# Patient Record
Sex: Male | Born: 1945 | Race: White | Hispanic: No | Marital: Single | Smoking: Former smoker
Health system: Southern US, Community
[De-identification: ages and names within clinical notes are randomized; demographics above are authoritative.]

## PROBLEM LIST (undated history)

## (undated) DIAGNOSIS — I10 Essential (primary) hypertension: Secondary | ICD-10-CM

## (undated) DIAGNOSIS — E7800 Pure hypercholesterolemia, unspecified: Secondary | ICD-10-CM

## (undated) DIAGNOSIS — E876 Hypokalemia: Secondary | ICD-10-CM

## (undated) DIAGNOSIS — K625 Hemorrhage of anus and rectum: Secondary | ICD-10-CM

## (undated) DIAGNOSIS — M109 Gout, unspecified: Secondary | ICD-10-CM

## (undated) DIAGNOSIS — K5790 Diverticulosis of intestine, part unspecified, without perforation or abscess without bleeding: Secondary | ICD-10-CM

## (undated) DIAGNOSIS — K649 Unspecified hemorrhoids: Secondary | ICD-10-CM

## (undated) DIAGNOSIS — I2510 Atherosclerotic heart disease of native coronary artery without angina pectoris: Secondary | ICD-10-CM

## (undated) DIAGNOSIS — K219 Gastro-esophageal reflux disease without esophagitis: Secondary | ICD-10-CM

## (undated) HISTORY — DX: Gastro-esophageal reflux disease without esophagitis: K21.9

## (undated) HISTORY — PX: CORONARY ARTERY BYPASS GRAFT: SHX141

## (undated) HISTORY — PX: OTHER SURGICAL HISTORY: SHX169

## (undated) HISTORY — PX: COLONOSCOPY: SHX174

## (undated) HISTORY — PX: CARDIAC SURGERY: SHX584

---

## 2003-10-04 ENCOUNTER — Emergency Department (HOSPITAL_COMMUNITY): Admission: EM | Admit: 2003-10-04 | Discharge: 2003-10-04 | Payer: Self-pay | Admitting: Interventional Radiology

## 2003-10-08 ENCOUNTER — Ambulatory Visit (HOSPITAL_COMMUNITY): Admission: RE | Admit: 2003-10-08 | Discharge: 2003-10-08 | Payer: Self-pay | Admitting: Orthopedic Surgery

## 2004-03-18 ENCOUNTER — Encounter (HOSPITAL_COMMUNITY): Admission: RE | Admit: 2004-03-18 | Discharge: 2004-04-17 | Payer: Self-pay | Admitting: Pulmonary Disease

## 2004-12-07 ENCOUNTER — Ambulatory Visit (HOSPITAL_COMMUNITY): Admission: RE | Admit: 2004-12-07 | Discharge: 2004-12-07 | Payer: Self-pay | Admitting: Pulmonary Disease

## 2004-12-22 ENCOUNTER — Ambulatory Visit: Payer: Self-pay | Admitting: Cardiology

## 2004-12-22 ENCOUNTER — Encounter: Payer: Self-pay | Admitting: Emergency Medicine

## 2004-12-22 ENCOUNTER — Inpatient Hospital Stay (HOSPITAL_COMMUNITY): Admission: EM | Admit: 2004-12-22 | Discharge: 2004-12-31 | Payer: Self-pay | Admitting: Emergency Medicine

## 2004-12-24 ENCOUNTER — Ambulatory Visit: Payer: Self-pay | Admitting: Internal Medicine

## 2005-01-26 ENCOUNTER — Emergency Department (HOSPITAL_COMMUNITY): Admission: EM | Admit: 2005-01-26 | Discharge: 2005-01-26 | Payer: Self-pay | Admitting: Emergency Medicine

## 2005-04-29 ENCOUNTER — Emergency Department (HOSPITAL_COMMUNITY): Admission: EM | Admit: 2005-04-29 | Discharge: 2005-04-29 | Payer: Self-pay | Admitting: Emergency Medicine

## 2005-07-18 ENCOUNTER — Emergency Department (HOSPITAL_COMMUNITY): Admission: EM | Admit: 2005-07-18 | Discharge: 2005-07-18 | Payer: Self-pay | Admitting: Emergency Medicine

## 2005-07-22 ENCOUNTER — Emergency Department (HOSPITAL_COMMUNITY): Admission: RE | Admit: 2005-07-22 | Discharge: 2005-07-22 | Payer: Self-pay | Admitting: Emergency Medicine

## 2005-07-27 ENCOUNTER — Ambulatory Visit (HOSPITAL_COMMUNITY): Admission: RE | Admit: 2005-07-27 | Discharge: 2005-07-27 | Payer: Self-pay | Admitting: Urology

## 2005-08-06 ENCOUNTER — Emergency Department (HOSPITAL_COMMUNITY): Admission: EM | Admit: 2005-08-06 | Discharge: 2005-08-06 | Payer: Self-pay | Admitting: Emergency Medicine

## 2005-08-15 ENCOUNTER — Emergency Department (HOSPITAL_COMMUNITY): Admission: EM | Admit: 2005-08-15 | Discharge: 2005-08-15 | Payer: Self-pay | Admitting: Emergency Medicine

## 2005-08-23 ENCOUNTER — Emergency Department (HOSPITAL_COMMUNITY): Admission: EM | Admit: 2005-08-23 | Discharge: 2005-08-23 | Payer: Self-pay | Admitting: Emergency Medicine

## 2005-08-27 ENCOUNTER — Emergency Department (HOSPITAL_COMMUNITY): Admission: EM | Admit: 2005-08-27 | Discharge: 2005-08-27 | Payer: Self-pay | Admitting: Emergency Medicine

## 2005-09-18 ENCOUNTER — Emergency Department (HOSPITAL_COMMUNITY): Admission: EM | Admit: 2005-09-18 | Discharge: 2005-09-18 | Payer: Self-pay | Admitting: Emergency Medicine

## 2007-01-05 ENCOUNTER — Emergency Department (HOSPITAL_COMMUNITY): Admission: EM | Admit: 2007-01-05 | Discharge: 2007-01-05 | Payer: Self-pay | Admitting: Emergency Medicine

## 2010-12-28 ENCOUNTER — Emergency Department (HOSPITAL_COMMUNITY)
Admission: EM | Admit: 2010-12-28 | Discharge: 2010-12-28 | Discharge status: Against Medical Advice | Payer: Non-veteran care | Attending: Emergency Medicine | Admitting: Emergency Medicine

## 2010-12-28 ENCOUNTER — Emergency Department (HOSPITAL_COMMUNITY): Payer: Non-veteran care

## 2010-12-28 DIAGNOSIS — J449 Chronic obstructive pulmonary disease, unspecified: Secondary | ICD-10-CM | POA: Insufficient documentation

## 2010-12-28 DIAGNOSIS — R059 Cough, unspecified: Secondary | ICD-10-CM | POA: Insufficient documentation

## 2010-12-28 DIAGNOSIS — J029 Acute pharyngitis, unspecified: Secondary | ICD-10-CM | POA: Insufficient documentation

## 2010-12-28 DIAGNOSIS — J4489 Other specified chronic obstructive pulmonary disease: Secondary | ICD-10-CM | POA: Insufficient documentation

## 2010-12-28 DIAGNOSIS — R05 Cough: Secondary | ICD-10-CM | POA: Insufficient documentation

## 2010-12-28 LAB — RAPID STREP SCREEN (MED CTR MEBANE ONLY): Streptococcus, Group A Screen (Direct): NEGATIVE

## 2012-04-20 ENCOUNTER — Emergency Department (HOSPITAL_COMMUNITY)
Admission: EM | Admit: 2012-04-20 | Discharge: 2012-04-20 | Disposition: A | Discharge status: Home / Self Care | Payer: Non-veteran care | Attending: Emergency Medicine | Admitting: Emergency Medicine

## 2012-04-20 ENCOUNTER — Encounter (HOSPITAL_COMMUNITY): Payer: Self-pay | Admitting: *Deleted

## 2012-04-20 DIAGNOSIS — W268XXA Contact with other sharp object(s), not elsewhere classified, initial encounter: Secondary | ICD-10-CM | POA: Insufficient documentation

## 2012-04-20 DIAGNOSIS — I10 Essential (primary) hypertension: Secondary | ICD-10-CM | POA: Insufficient documentation

## 2012-04-20 DIAGNOSIS — Z951 Presence of aortocoronary bypass graft: Secondary | ICD-10-CM | POA: Insufficient documentation

## 2012-04-20 DIAGNOSIS — S61209A Unspecified open wound of unspecified finger without damage to nail, initial encounter: Secondary | ICD-10-CM | POA: Insufficient documentation

## 2012-04-20 DIAGNOSIS — S61219A Laceration without foreign body of unspecified finger without damage to nail, initial encounter: Secondary | ICD-10-CM

## 2012-04-20 DIAGNOSIS — I2510 Atherosclerotic heart disease of native coronary artery without angina pectoris: Secondary | ICD-10-CM | POA: Insufficient documentation

## 2012-04-20 HISTORY — DX: Essential (primary) hypertension: I10

## 2012-04-20 HISTORY — DX: Atherosclerotic heart disease of native coronary artery without angina pectoris: I25.10
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
## 2012-04-20 NOTE — ED Notes (Signed)
Lac to rt hand when opening a bottle.

## 2012-04-20 NOTE — ED Notes (Signed)
Pt with lac to right index finger, bleeding controlled at this time, believes last tetanus shot in 2008

## 2012-04-21 NOTE — ED Provider Notes (Signed)
Medical screening examination/treatment/procedure(s) were performed by non-physician practitioner and as supervising physician I was immediately available for consultation/collaboration.  Donnetta Hutching, MD 04/21/12 [phone]

## 2013-10-19 ENCOUNTER — Emergency Department (HOSPITAL_COMMUNITY)
Admission: EM | Admit: 2013-10-19 | Discharge: 2013-10-20 | Disposition: A | Discharge status: Home / Self Care | Payer: Non-veteran care | Attending: Emergency Medicine | Admitting: Emergency Medicine

## 2013-10-19 ENCOUNTER — Emergency Department (HOSPITAL_COMMUNITY): Payer: Non-veteran care

## 2013-10-19 ENCOUNTER — Encounter (HOSPITAL_COMMUNITY): Payer: Self-pay | Admitting: Emergency Medicine

## 2013-10-19 DIAGNOSIS — R509 Fever, unspecified: Secondary | ICD-10-CM

## 2013-10-19 DIAGNOSIS — Z951 Presence of aortocoronary bypass graft: Secondary | ICD-10-CM | POA: Insufficient documentation

## 2013-10-19 DIAGNOSIS — J4489 Other specified chronic obstructive pulmonary disease: Secondary | ICD-10-CM | POA: Insufficient documentation

## 2013-10-19 DIAGNOSIS — Z87891 Personal history of nicotine dependence: Secondary | ICD-10-CM | POA: Insufficient documentation

## 2013-10-19 DIAGNOSIS — Z87828 Personal history of other (healed) physical injury and trauma: Secondary | ICD-10-CM | POA: Insufficient documentation

## 2013-10-19 DIAGNOSIS — E7800 Pure hypercholesterolemia, unspecified: Secondary | ICD-10-CM | POA: Insufficient documentation

## 2013-10-19 DIAGNOSIS — R05 Cough: Secondary | ICD-10-CM | POA: Insufficient documentation

## 2013-10-19 DIAGNOSIS — I10 Essential (primary) hypertension: Secondary | ICD-10-CM | POA: Insufficient documentation

## 2013-10-19 DIAGNOSIS — I2510 Atherosclerotic heart disease of native coronary artery without angina pectoris: Secondary | ICD-10-CM | POA: Insufficient documentation

## 2013-10-19 DIAGNOSIS — R059 Cough, unspecified: Secondary | ICD-10-CM | POA: Insufficient documentation

## 2013-10-19 DIAGNOSIS — J449 Chronic obstructive pulmonary disease, unspecified: Secondary | ICD-10-CM | POA: Insufficient documentation

## 2013-10-19 HISTORY — DX: Pure hypercholesterolemia, unspecified: E78.00

## 2013-10-19 HISTORY — DX: Hypokalemia: E87.6

## 2013-10-19 MED ORDER — ACETAMINOPHEN 500 MG PO TABS
1000.0000 mg | ORAL_TABLET | Freq: Once | ORAL | Status: AC
  Administered 2013-10-19: 1000 mg via ORAL
  Filled 2013-10-19: qty 2
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
## 2013-10-19 NOTE — ED Notes (Addendum)
Pt c/o fever and a high heart rate of "101" at home. Pts heart rate here is 92. Pt c/o pain in his feet and hands.

## 2013-10-20 LAB — URINALYSIS, ROUTINE W REFLEX MICROSCOPIC
BILIRUBIN URINE: NEGATIVE
Glucose, UA: NEGATIVE mg/dL
HGB URINE DIPSTICK: NEGATIVE
Ketones, ur: NEGATIVE mg/dL
Leukocytes, UA: NEGATIVE
Nitrite: NEGATIVE
PH: 6 (ref 5.0–8.0)
Protein, ur: NEGATIVE mg/dL
Specific Gravity, Urine: 1.01 (ref 1.005–1.030)
Urobilinogen, UA: 0.2 mg/dL (ref 0.0–1.0)

## 2013-10-20 NOTE — Discharge Instructions (Signed)
Please call your doctor for a followup appointment within 24-48 hours. When you talk to your doctor please let them know that you were seen in the emergency department and have them acquire all of your records so that they can discuss the findings with you and formulate a treatment plan to fully care for your new and ongoing problems. ° °[facility] Doctor List ° ° ° °Edward Hawkins MD. Specialty: Pulmonary Disease Contact information: [address]  °[address]  °[place] [address]  °[phone]  ° °Margaret Simpson, MD. Specialty: Family Medicine Contact information: [address]  °[place] [address]  °[phone]  ° °Scott Luking, MD. Specialty: Family Medicine Contact information: [address]  °[address]  °[place] [address]  °[phone]  ° °Tesfaye Fanta, MD Specialty: Internal Medicine Contact information: [address]  °[place] [address]  °[phone]  ° °Zach Hall, MD. Specialty: Internal Medicine Contact information: [address]  °[place] [address]  °[phone]  ° °Angus Mcinnis, MD. Specialty: Family Medicine Contact information: [address]  °[place] [address]  °[phone]  ° °Stephen Knowlton, MD. Specialty: Family Medicine Contact information: [address]  °[address]  °[place] [address]  °[phone]  ° °Roy Fagan, MD. Specialty: Internal Medicine Contact information: [address]  °[address]  ° [place] [address]  °[phone]  ° ° °

## 2013-12-31 ENCOUNTER — Encounter (HOSPITAL_COMMUNITY): Payer: Self-pay | Admitting: Emergency Medicine

## 2013-12-31 ENCOUNTER — Emergency Department (HOSPITAL_COMMUNITY)
Admission: EM | Admit: 2013-12-31 | Discharge: 2013-12-31 | Disposition: A | Discharge status: Home / Self Care | Payer: Non-veteran care | Attending: Emergency Medicine | Admitting: Emergency Medicine

## 2013-12-31 DIAGNOSIS — Z7982 Long term (current) use of aspirin: Secondary | ICD-10-CM | POA: Insufficient documentation

## 2013-12-31 DIAGNOSIS — L0390 Cellulitis, unspecified: Secondary | ICD-10-CM

## 2013-12-31 DIAGNOSIS — I10 Essential (primary) hypertension: Secondary | ICD-10-CM | POA: Insufficient documentation

## 2013-12-31 DIAGNOSIS — L0291 Cutaneous abscess, unspecified: Secondary | ICD-10-CM

## 2013-12-31 DIAGNOSIS — Z951 Presence of aortocoronary bypass graft: Secondary | ICD-10-CM | POA: Insufficient documentation

## 2013-12-31 DIAGNOSIS — Z87828 Personal history of other (healed) physical injury and trauma: Secondary | ICD-10-CM | POA: Insufficient documentation

## 2013-12-31 DIAGNOSIS — I2510 Atherosclerotic heart disease of native coronary artery without angina pectoris: Secondary | ICD-10-CM | POA: Insufficient documentation

## 2013-12-31 DIAGNOSIS — Z79899 Other long term (current) drug therapy: Secondary | ICD-10-CM | POA: Insufficient documentation

## 2013-12-31 DIAGNOSIS — Z87891 Personal history of nicotine dependence: Secondary | ICD-10-CM | POA: Insufficient documentation

## 2013-12-31 DIAGNOSIS — L02419 Cutaneous abscess of limb, unspecified: Secondary | ICD-10-CM | POA: Insufficient documentation

## 2013-12-31 DIAGNOSIS — E785 Hyperlipidemia, unspecified: Secondary | ICD-10-CM | POA: Insufficient documentation

## 2013-12-31 DIAGNOSIS — L03119 Cellulitis of unspecified part of limb: Principal | ICD-10-CM

## 2013-12-31 MED ORDER — SULFAMETHOXAZOLE-TRIMETHOPRIM 800-160 MG PO TABS: 1.0000 | ORAL_TABLET | Freq: Two times a day (BID) | ORAL | Status: DC
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
## 2013-12-31 NOTE — Discharge Instructions (Signed)
Abscess Care After An abscess (also called a boil or furuncle) is an infected area that contains a collection of pus. Signs and symptoms of an abscess include pain, tenderness, redness, or hardness, or you may feel a moveable soft area under your skin. An abscess can occur anywhere in the body. The infection may spread to surrounding tissues causing cellulitis. The boil may be painful for 5 to 7 days. Most people with a boil do not have high fevers. Your abscess, if seen early, may not have localized, and may not have been lanced. If not, another appointment may be required for this if it does not get better on its own or with medications. HOME CARE INSTRUCTIONS   Only take over-the-counter or prescription medicines for pain, discomfort, or fever as directed by your caregiver.  When you bathe, soak and then remove gauze or iodoform packs at least daily or as directed by your caregiver. You may then wash the wound gently with mild soapy water. Repack with gauze or do as your caregiver directs. SEEK IMMEDIATE MEDICAL CARE IF:   You develop increased pain, swelling, redness, drainage, or bleeding in the wound site.  You develop signs of generalized infection including muscle aches, chills, fever, or a general ill feeling.  An oral temperature above 102 F (38.9 C) develops, not controlled by medication. See your caregiver for a recheck if you develop any of the symptoms described above. If medications (antibiotics) were prescribed, take them as directed. Document Released: 03/17/2005 Document Revised: 11/21/2011 Document Reviewed: 11/12/2007 [facility] Patient Information 2014 [place].

## 2013-12-31 NOTE — ED Notes (Signed)
Pt with abscess to left upper inner thigh for 3 weeks and states started draining yesterday, hx of same, fever on Sat. Night denies fever since

## 2013-12-31 NOTE — ED Notes (Signed)
Abscess to lt upper medial thigh, has opened up and drained on its own.  Some redness around it

## 2014-01-01 NOTE — ED Provider Notes (Signed)
Medical screening examination/treatment/procedure(s) were performed by non-physician practitioner and as supervising physician I was immediately available for consultation/collaboration.   EKG Interpretation None        Alfonzo Feller, DO 01/01/14 [phone]

## 2014-02-26 ENCOUNTER — Emergency Department (HOSPITAL_COMMUNITY)
Admission: EM | Admit: 2014-02-26 | Discharge: 2014-02-26 | Disposition: A | Discharge status: Home / Self Care | Payer: Non-veteran care | Attending: Emergency Medicine | Admitting: Emergency Medicine

## 2014-02-26 ENCOUNTER — Encounter (HOSPITAL_COMMUNITY): Payer: Self-pay | Admitting: Emergency Medicine

## 2014-02-26 DIAGNOSIS — I10 Essential (primary) hypertension: Secondary | ICD-10-CM | POA: Insufficient documentation

## 2014-02-26 DIAGNOSIS — Z7982 Long term (current) use of aspirin: Secondary | ICD-10-CM | POA: Insufficient documentation

## 2014-02-26 DIAGNOSIS — Z87891 Personal history of nicotine dependence: Secondary | ICD-10-CM | POA: Insufficient documentation

## 2014-02-26 DIAGNOSIS — I2510 Atherosclerotic heart disease of native coronary artery without angina pectoris: Secondary | ICD-10-CM | POA: Insufficient documentation

## 2014-02-26 DIAGNOSIS — Z79899 Other long term (current) drug therapy: Secondary | ICD-10-CM | POA: Insufficient documentation

## 2014-02-26 DIAGNOSIS — Z951 Presence of aortocoronary bypass graft: Secondary | ICD-10-CM | POA: Insufficient documentation

## 2014-02-26 DIAGNOSIS — L0390 Cellulitis, unspecified: Secondary | ICD-10-CM

## 2014-02-26 DIAGNOSIS — L02419 Cutaneous abscess of limb, unspecified: Secondary | ICD-10-CM | POA: Insufficient documentation

## 2014-02-26 DIAGNOSIS — E7800 Pure hypercholesterolemia, unspecified: Secondary | ICD-10-CM | POA: Insufficient documentation

## 2014-02-26 DIAGNOSIS — Z792 Long term (current) use of antibiotics: Secondary | ICD-10-CM | POA: Insufficient documentation

## 2014-02-26 DIAGNOSIS — L03119 Cellulitis of unspecified part of limb: Principal | ICD-10-CM

## 2014-02-26 MED ORDER — OXYCODONE-ACETAMINOPHEN 5-325 MG PO TABS
2.0000 | ORAL_TABLET | Freq: Once | ORAL | Status: AC
  Administered 2014-02-26: 2 via ORAL
  Filled 2014-02-26: qty 2

## 2014-02-26 MED ORDER — IBUPROFEN 400 MG PO TABS: 400.0000 mg | ORAL_TABLET | Freq: Four times a day (QID) | ORAL | Status: DC | PRN

## 2014-02-26 MED ORDER — DOXYCYCLINE HYCLATE 100 MG PO CAPS: 100.0000 mg | ORAL_CAPSULE | Freq: Two times a day (BID) | ORAL | Status: DC

## 2014-02-26 NOTE — ED Notes (Signed)
Abscess to left leg x 3 days.

## 2014-02-26 NOTE — ED Notes (Signed)
Patient with no complaints at this time. Respirations even and unlabored. Skin warm/dry. Discharge instructions reviewed with patient at this time. Patient given opportunity to voice concerns/ask questions. Patient discharged at this time and left Emergency Department with steady gait.   
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
## 2014-02-26 NOTE — Discharge Instructions (Signed)
Cellulitis Cellulitis is an infection of the skin and the tissue beneath it. The infected area is usually red and tender. Cellulitis occurs most often in the arms and lower legs.  CAUSES  Cellulitis is caused by bacteria that enter the skin through cracks or cuts in the skin. The most common types of bacteria that cause cellulitis are Staphylococcus and Streptococcus. SYMPTOMS   Redness and warmth.  Swelling.  Tenderness or pain.  Fever. DIAGNOSIS  Your caregiver can usually determine what is wrong based on a physical exam. Blood tests may also be done. TREATMENT  Treatment usually involves taking an antibiotic medicine. HOME CARE INSTRUCTIONS   Take your antibiotics as directed. Finish them even if you start to feel better.  Keep the infected arm or leg elevated to reduce swelling.  Apply a warm cloth to the affected area up to 4 times per day to relieve pain.  Only take over-the-counter or prescription medicines for pain, discomfort, or fever as directed by your caregiver.  Keep all follow-up appointments as directed by your caregiver. SEEK MEDICAL CARE IF:   You notice red streaks coming from the infected area.  Your red area gets larger or turns dark in color.  Your bone or joint underneath the infected area becomes painful after the skin has healed.  Your infection returns in the same area or another area.  You notice a swollen bump in the infected area.  You develop new symptoms. SEEK IMMEDIATE MEDICAL CARE IF:   You have a fever.  You feel very sleepy.  You develop vomiting or diarrhea.  You have a general ill feeling (malaise) with muscle aches and pains. MAKE SURE YOU:   Understand these instructions.  Will watch your condition.  Will get help right away if you are not doing well or get worse. Document Released: 06/08/2005 Document Revised: 02/28/2012 Document Reviewed: 11/14/2011 [facility] Patient Information 2015 [place], [place]. This information is  not intended to replace advice given to you by your health care provider. Make sure you discuss any questions you have with your health care provider.  Cellulitis Cellulitis is an infection of the skin and the tissue beneath it. The infected area is usually red and tender. Cellulitis occurs most often in the arms and lower legs.  CAUSES  Cellulitis is caused by bacteria that enter the skin through cracks or cuts in the skin. The most common types of bacteria that cause cellulitis are Staphylococcus and Streptococcus. SYMPTOMS   Redness and warmth.  Swelling.  Tenderness or pain.  Fever. DIAGNOSIS  Your caregiver can usually determine what is wrong based on a physical exam. Blood tests may also be done. TREATMENT  Treatment usually involves taking an antibiotic medicine. HOME CARE INSTRUCTIONS   Take your antibiotics as directed. Finish them even if you start to feel better.  Keep the infected arm or leg elevated to reduce swelling.  Apply a warm cloth to the affected area up to 4 times per day to relieve pain.  Only take over-the-counter or prescription medicines for pain, discomfort, or fever as directed by your caregiver.  Keep all follow-up appointments as directed by your caregiver. SEEK MEDICAL CARE IF:   You notice red streaks coming from the infected area.  Your red area gets larger or turns dark in color.  Your bone or joint underneath the infected area becomes painful after the skin has healed.  Your infection returns in the same area or another area.  You notice a swollen bump  in the infected area.  You develop new symptoms. SEEK IMMEDIATE MEDICAL CARE IF:   You have a fever.  You feel very sleepy.  You develop vomiting or diarrhea.  You have a general ill feeling (malaise) with muscle aches and pains. MAKE SURE YOU:   Understand these instructions.  Will watch your condition.  Will get help right away if you are not doing well or get  worse. Document Released: 06/08/2005 Document Revised: 02/28/2012 Document Reviewed: 11/14/2011 [facility] Patient Information 2015 [place], [place]. This information is not intended to replace advice given to you by your health care provider. Make sure you discuss any questions you have with your health care provider.

## 2014-07-17 ENCOUNTER — Encounter: Payer: Self-pay | Admitting: Gastroenterology

## 2014-08-21 ENCOUNTER — Encounter: Payer: Self-pay | Admitting: Gastroenterology

## 2014-08-21 ENCOUNTER — Ambulatory Visit (INDEPENDENT_AMBULATORY_CARE_PROVIDER_SITE_OTHER): Payer: Non-veteran care | Admitting: Gastroenterology

## 2014-08-21 VITALS — BP 120/67 | HR 75 | Temp 97.7°F | Ht 67.0 in | Wt 198.8 lb

## 2014-08-21 DIAGNOSIS — Z86010 Personal history of colonic polyps: Secondary | ICD-10-CM

## 2014-08-21 DIAGNOSIS — K219 Gastro-esophageal reflux disease without esophagitis: Secondary | ICD-10-CM | POA: Insufficient documentation
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
## 2014-08-21 NOTE — Patient Instructions (Addendum)
Start taking Dexilant once each morning; I have provided samples. Call us if this works well for you, and we can send to the pharmacy. Please review the reflux diet.   I will get the reports from the colonoscopy to see when we need to pursue another one.   In the meantime, start taking Benefiber or Metamucil daily as a fiber supplement.   Gastroesophageal Reflux Disease, Adult Gastroesophageal reflux disease (GERD) happens when acid from your stomach flows up into the esophagus. When acid comes in contact with the esophagus, the acid causes soreness (inflammation) in the esophagus. Over time, GERD may create small holes (ulcers) in the lining of the esophagus. CAUSES   Increased body weight. This puts pressure on the stomach, making acid rise from the stomach into the esophagus.  Smoking. This increases acid production in the stomach.  Drinking alcohol. This causes decreased pressure in the lower esophageal sphincter (valve or ring of muscle between the esophagus and stomach), allowing acid from the stomach into the esophagus.  Late evening meals and a full stomach. This increases pressure and acid production in the stomach.  A malformed lower esophageal sphincter. Sometimes, no cause is found. SYMPTOMS   Burning pain in the lower part of the mid-chest behind the breastbone and in the mid-stomach area. This may occur twice a week or more often.  Trouble swallowing.  Sore throat.  Dry cough.  Asthma-like symptoms including chest tightness, shortness of breath, or wheezing. DIAGNOSIS  Your caregiver may be able to diagnose GERD based on your symptoms. In some cases, X-rays and other tests may be done to check for complications or to check the condition of your stomach and esophagus. TREATMENT  Your caregiver may recommend over-the-counter or prescription medicines to help decrease acid production. Ask your caregiver before starting or adding any new medicines.  HOME CARE INSTRUCTIONS    Change the factors that you can control. Ask your caregiver for guidance concerning weight loss, quitting smoking, and alcohol consumption.  Avoid foods and drinks that make your symptoms worse, such as:  Caffeine or alcoholic drinks.  Chocolate.  Peppermint or mint flavorings.  Garlic and onions.  Spicy foods.  Citrus fruits, such as oranges, lemons, or limes.  Tomato-based foods such as sauce, chili, salsa, and pizza.  Fried and fatty foods.  Avoid lying down for the 3 hours prior to your bedtime or prior to taking a nap.  Eat small, frequent meals instead of large meals.  Wear loose-fitting clothing. Do not wear anything tight around your waist that causes pressure on your stomach.  Raise the head of your bed 6 to 8 inches with wood blocks to help you sleep. Extra pillows will not help.  Only take over-the-counter or prescription medicines for pain, discomfort, or fever as directed by your caregiver.  Do not take aspirin, ibuprofen, or other nonsteroidal anti-inflammatory drugs (NSAIDs). SEEK IMMEDIATE MEDICAL CARE IF:   You have pain in your arms, neck, jaw, teeth, or back.  Your pain increases or changes in intensity or duration.  You develop nausea, vomiting, or sweating (diaphoresis).  You develop shortness of breath, or you faint.  Your vomit is green, yellow, black, or looks like coffee grounds or blood.  Your stool is red, bloody, or black. These symptoms could be signs of other problems, such as heart disease, gastric bleeding, or esophageal bleeding. MAKE SURE YOU:   Understand these instructions.  Will watch your condition.  Will get help right away if you are  not doing well or get worse. Document Released: 06/08/2005 Document Revised: 11/21/2011 Document Reviewed: 03/18/2011 [facility] Patient Information 2015 [place], [place]. This information is not intended to replace advice given to you by your health care provider. Make sure you discuss any  questions you have with your health care provider.

## 2014-08-24 NOTE — Assessment & Plan Note (Addendum)
Symptomatic despite Protonix BID. Question dietary and behavior playing a role. GERD diet provided. Trial of Dexilant, samples provided. May ultimately need EGD if no improvement with diet changes. Would need Propofol due to history of failed sedation.

## 2014-08-24 NOTE — Assessment & Plan Note (Signed)
68 year old male with reported adenomatous polyps on recent colonoscopy in Aug 2015. Need to request operative notes. Low-volume hematochezia intermittently likely benign. Patient requesting updated colonoscopy. Discussed needing operative notes prior to any decision made. Could benefit from [place] banding but need to review notes first. In interim, add supplemental fiber daily.

## 2014-08-25 NOTE — Progress Notes (Signed)
cc'ed to [facility]

## 2014-08-29 ENCOUNTER — Other Ambulatory Visit: Payer: Self-pay

## 2014-09-01 MED ORDER — DEXLANSOPRAZOLE 60 MG PO CPDR: 60.0000 mg | DELAYED_RELEASE_CAPSULE | Freq: Every day | ORAL | Status: DC

## 2014-11-04 ENCOUNTER — Emergency Department (HOSPITAL_COMMUNITY)
Admission: EM | Admit: 2014-11-04 | Discharge: 2014-11-04 | Disposition: A | Discharge status: Home / Self Care | Payer: Non-veteran care | Attending: Emergency Medicine | Admitting: Emergency Medicine

## 2014-11-04 ENCOUNTER — Emergency Department (HOSPITAL_COMMUNITY): Payer: Non-veteran care

## 2014-11-04 ENCOUNTER — Encounter (HOSPITAL_COMMUNITY): Payer: Self-pay | Admitting: *Deleted

## 2014-11-04 DIAGNOSIS — K219 Gastro-esophageal reflux disease without esophagitis: Secondary | ICD-10-CM | POA: Diagnosis not present

## 2014-11-04 DIAGNOSIS — Z79899 Other long term (current) drug therapy: Secondary | ICD-10-CM | POA: Diagnosis not present

## 2014-11-04 DIAGNOSIS — Y998 Other external cause status: Secondary | ICD-10-CM | POA: Diagnosis not present

## 2014-11-04 DIAGNOSIS — R52 Pain, unspecified: Secondary | ICD-10-CM

## 2014-11-04 DIAGNOSIS — Y9289 Other specified places as the place of occurrence of the external cause: Secondary | ICD-10-CM | POA: Insufficient documentation

## 2014-11-04 DIAGNOSIS — M1990 Unspecified osteoarthritis, unspecified site: Secondary | ICD-10-CM | POA: Diagnosis not present

## 2014-11-04 DIAGNOSIS — I252 Old myocardial infarction: Secondary | ICD-10-CM | POA: Diagnosis not present

## 2014-11-04 DIAGNOSIS — R079 Chest pain, unspecified: Secondary | ICD-10-CM | POA: Diagnosis not present

## 2014-11-04 DIAGNOSIS — X58XXXA Exposure to other specified factors, initial encounter: Secondary | ICD-10-CM | POA: Insufficient documentation

## 2014-11-04 DIAGNOSIS — Y9389 Activity, other specified: Secondary | ICD-10-CM | POA: Insufficient documentation

## 2014-11-04 DIAGNOSIS — I10 Essential (primary) hypertension: Secondary | ICD-10-CM | POA: Insufficient documentation

## 2014-11-04 DIAGNOSIS — Z7982 Long term (current) use of aspirin: Secondary | ICD-10-CM | POA: Diagnosis not present

## 2014-11-04 DIAGNOSIS — M19071 Primary osteoarthritis, right ankle and foot: Secondary | ICD-10-CM

## 2014-11-04 DIAGNOSIS — S93401A Sprain of unspecified ligament of right ankle, initial encounter: Secondary | ICD-10-CM

## 2014-11-04 DIAGNOSIS — E785 Hyperlipidemia, unspecified: Secondary | ICD-10-CM | POA: Insufficient documentation

## 2014-11-04 DIAGNOSIS — S99911A Unspecified injury of right ankle, initial encounter: Secondary | ICD-10-CM | POA: Diagnosis present

## 2014-11-04 MED ORDER — ACETAMINOPHEN 500 MG PO TABS
1000.0000 mg | ORAL_TABLET | Freq: Once | ORAL | Status: AC
  Administered 2014-11-04: 1000 mg via ORAL
  Filled 2014-11-04: qty 2

## 2014-11-04 NOTE — Discharge Instructions (Signed)
Please use the ankle splint for the next 5 to 7 days. Please return if not improving. Ankle Sprain An ankle sprain is an injury to the strong, fibrous tissues (ligaments) that hold the bones of your ankle joint together.  CAUSES An ankle sprain is usually caused by a fall or by twisting your ankle. Ankle sprains most commonly occur when you step on the outer edge of your foot, and your ankle turns inward. People who participate in sports are more prone to these types of injuries.  SYMPTOMS   Pain in your ankle. The pain may be present at rest or only when you are trying to stand or walk.  Swelling.  Bruising. Bruising may develop immediately or within 1 to 2 days after your injury.  Difficulty standing or walking, particularly when turning corners or changing directions. DIAGNOSIS  Your caregiver will ask you details about your injury and perform a physical exam of your ankle to determine if you have an ankle sprain. During the physical exam, your caregiver will press on and apply pressure to specific areas of your foot and ankle. Your caregiver will try to move your ankle in certain ways. An X-ray exam may be done to be sure a bone was not broken or a ligament did not separate from one of the bones in your ankle (avulsion fracture).  TREATMENT  Certain types of braces can help stabilize your ankle. Your caregiver can make a recommendation for this. Your caregiver may recommend the use of medicine for pain. If your sprain is severe, your caregiver may refer you to a surgeon who helps to restore function to parts of your skeletal system (orthopedist) or a physical therapist. [place] ice to your injury for 1-2 days or as directed by your caregiver. Applying ice helps to reduce inflammation and pain.  Put ice in a plastic bag.  Place a towel between your skin and the bag.  Leave the ice on for 15-20 minutes at a time, every 2 hours while you are awake.  Only take  over-the-counter or prescription medicines for pain, discomfort, or fever as directed by your caregiver.  Elevate your injured ankle above the level of your heart as much as possible for 2-3 days.  If your caregiver recommends crutches, use them as instructed. Gradually put weight on the affected ankle. Continue to use crutches or a cane until you can walk without feeling pain in your ankle.  If you have a plaster splint, wear the splint as directed by your caregiver. Do not rest it on anything harder than a pillow for the first 24 hours. Do not put weight on it. Do not get it wet. You may take it off to take a shower or bath.  You may have been given an elastic bandage to wear around your ankle to provide support. If the elastic bandage is too tight (you have numbness or tingling in your foot or your foot becomes cold and blue), adjust the bandage to make it comfortable.  If you have an air splint, you may blow more air into it or let air out to make it more comfortable. You may take your splint off at night and before taking a shower or bath. Wiggle your toes in the splint several times per day to decrease swelling. SEEK MEDICAL CARE IF:   You have rapidly increasing bruising or swelling.  Your toes feel extremely cold or you lose feeling in your foot.  Your pain  is not relieved with medicine. SEEK IMMEDIATE MEDICAL CARE IF:  Your toes are numb or blue.  You have severe pain that is increasing. MAKE SURE YOU:   Understand these instructions.  Will watch your condition.  Will get help right away if you are not doing well or get worse. Document Released: 08/29/2005 Document Revised: 05/23/2012 Document Reviewed: 09/10/2011 [facility] Patient Information 2015 [place], [place]. This information is not intended to replace advice given to you by your health care provider. Make sure you discuss any questions you have with your health care provider.  Arthritis, Nonspecific Arthritis is  inflammation of a joint. This usually means pain, redness, warmth or swelling are present. One or more joints may be involved. There are a number of types of arthritis. Your caregiver may not be able to tell what type of arthritis you have right away. CAUSES  The most common cause of arthritis is the wear and tear on the joint (osteoarthritis). This causes damage to the cartilage, which can break down over time. The knees, hips, back and neck are most often affected by this type of arthritis. Other types of arthritis and common causes of joint pain include:  Sprains and other injuries near the joint. Sometimes minor sprains and injuries cause pain and swelling that develop hours later.  Rheumatoid arthritis. This affects hands, feet and knees. It usually affects both sides of your body at the same time. It is often associated with chronic ailments, fever, weight loss and general weakness.  Crystal arthritis. Gout and pseudo gout can cause occasional acute severe pain, redness and swelling in the foot, ankle, or knee.  Infectious arthritis. Bacteria can get into a joint through a break in overlying skin. This can cause infection of the joint. Bacteria and viruses can also spread through the blood and affect your joints.  Drug, infectious and allergy reactions. Sometimes joints can become mildly painful and slightly swollen with these types of illnesses. SYMPTOMS   Pain is the main symptom.  Your joint or joints can also be red, swollen and warm or hot to the touch.  You may have a fever with certain types of arthritis, or even feel overall ill.  The joint with arthritis will hurt with movement. Stiffness is present with some types of arthritis. DIAGNOSIS  Your caregiver will suspect arthritis based on your description of your symptoms and on your exam. Testing may be needed to find the type of arthritis:  Blood and sometimes urine tests.  X-ray tests and sometimes CT or MRI  scans.  Removal of fluid from the joint (arthrocentesis) is done to check for bacteria, crystals or other causes. Your caregiver (or a specialist) will numb the area over the joint with a local anesthetic, and use a needle to remove joint fluid for examination. This procedure is only minimally uncomfortable.  Even with these tests, your caregiver may not be able to tell what kind of arthritis you have. Consultation with a specialist (rheumatologist) may be helpful. TREATMENT  Your caregiver will discuss with you treatment specific to your type of arthritis. If the specific type cannot be determined, then the following general recommendations may apply. Treatment of severe joint pain includes:  Rest.  Elevation.  Anti-inflammatory medication (for example, ibuprofen) may be prescribed. Avoiding activities that cause increased pain.  Only take over-the-counter or prescription medicines for pain and discomfort as recommended by your caregiver.  Cold packs over an inflamed joint may be used for 10 to 15 minutes every  hour. Hot packs sometimes feel better, but do not use overnight. Do not use hot packs if you are diabetic without your caregiver's permission.  A cortisone shot into arthritic joints may help reduce pain and swelling.  Any acute arthritis that gets worse over the next 1 to 2 days needs to be looked at to be sure there is no joint infection. Long-term arthritis treatment involves modifying activities and lifestyle to reduce joint stress jarring. This can include weight loss. Also, exercise is needed to nourish the joint cartilage and remove waste. This helps keep the muscles around the joint strong. HOME CARE INSTRUCTIONS   Do not take aspirin to relieve pain if gout is suspected. This elevates uric acid levels.  Only take over-the-counter or prescription medicines for pain, discomfort or fever as directed by your caregiver.  Rest the joint as much as possible.  If your joint is  swollen, keep it elevated.  Use crutches if the painful joint is in your leg.  Drinking plenty of fluids may help for certain types of arthritis.  Follow your caregiver's dietary instructions.  Try low-impact exercise such as:  Swimming.  Water aerobics.  Biking.  Walking.  Morning stiffness is often relieved by a warm shower.  Put your joints through regular range-of-motion. SEEK MEDICAL CARE IF:   You do not feel better in 24 hours or are getting worse.  You have side effects to medications, or are not getting better with treatment. SEEK IMMEDIATE MEDICAL CARE IF:   You have a fever.  You develop severe joint pain, swelling or redness.  Many joints are involved and become painful and swollen.  There is severe back pain and/or leg weakness.  You have loss of bowel or bladder control. Document Released: 10/06/2004 Document Revised: 11/21/2011 Document Reviewed: 10/22/2008 [facility] Patient Information 2015 [place], [place]. This information is not intended to replace advice given to you by your health care provider. Make sure you discuss any questions you have with your health care provider.
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
## 2014-11-04 NOTE — ED Notes (Addendum)
Pain rt ankle 6 days, pt says he has injured in past , but no recent new injury known  Points to lat malleolus as site of pain  Walking with limp

## 2015-03-24 ENCOUNTER — Emergency Department (HOSPITAL_COMMUNITY)
Admission: EM | Admit: 2015-03-24 | Discharge: 2015-03-24 | Disposition: A | Discharge status: Home / Self Care | Payer: Non-veteran care | Attending: Emergency Medicine | Admitting: Emergency Medicine

## 2015-03-24 ENCOUNTER — Encounter (HOSPITAL_COMMUNITY): Payer: Self-pay | Admitting: Emergency Medicine

## 2015-03-24 ENCOUNTER — Emergency Department (HOSPITAL_COMMUNITY): Payer: Non-veteran care

## 2015-03-24 DIAGNOSIS — I10 Essential (primary) hypertension: Secondary | ICD-10-CM | POA: Diagnosis not present

## 2015-03-24 DIAGNOSIS — Z7982 Long term (current) use of aspirin: Secondary | ICD-10-CM | POA: Insufficient documentation

## 2015-03-24 DIAGNOSIS — I2510 Atherosclerotic heart disease of native coronary artery without angina pectoris: Secondary | ICD-10-CM | POA: Insufficient documentation

## 2015-03-24 DIAGNOSIS — K219 Gastro-esophageal reflux disease without esophagitis: Secondary | ICD-10-CM | POA: Diagnosis not present

## 2015-03-24 DIAGNOSIS — M79672 Pain in left foot: Secondary | ICD-10-CM | POA: Diagnosis present

## 2015-03-24 DIAGNOSIS — M109 Gout, unspecified: Secondary | ICD-10-CM

## 2015-03-24 DIAGNOSIS — E780 Pure hypercholesterolemia: Secondary | ICD-10-CM | POA: Diagnosis not present

## 2015-03-24 DIAGNOSIS — M10072 Idiopathic gout, left ankle and foot: Secondary | ICD-10-CM | POA: Diagnosis not present

## 2015-03-24 DIAGNOSIS — Z9861 Coronary angioplasty status: Secondary | ICD-10-CM | POA: Insufficient documentation

## 2015-03-24 DIAGNOSIS — Z87891 Personal history of nicotine dependence: Secondary | ICD-10-CM | POA: Diagnosis not present

## 2015-03-24 DIAGNOSIS — Z79899 Other long term (current) drug therapy: Secondary | ICD-10-CM | POA: Diagnosis not present

## 2015-03-24 DIAGNOSIS — E876 Hypokalemia: Secondary | ICD-10-CM | POA: Insufficient documentation

## 2015-03-24 LAB — CBC WITH DIFFERENTIAL/PLATELET
Basophils Absolute: 0 10*3/uL (ref 0.0–0.1)
Basophils Relative: 0 % (ref 0–1)
EOS ABS: 0.3 10*3/uL (ref 0.0–0.7)
EOS PCT: 3 % (ref 0–5)
HEMATOCRIT: 41.7 % (ref 39.0–52.0)
Hemoglobin: 14.3 g/dL (ref 13.0–17.0)
LYMPHS ABS: 1.1 10*3/uL (ref 0.7–4.0)
Lymphocytes Relative: 11 % — ABNORMAL LOW (ref 12–46)
MCH: 31 pg (ref 26.0–34.0)
MCHC: 34.3 g/dL (ref 30.0–36.0)
MCV: 90.5 fL (ref 78.0–100.0)
MONOS PCT: 10 % (ref 3–12)
Monocytes Absolute: 1 10*3/uL (ref 0.1–1.0)
Neutro Abs: 7.3 10*3/uL (ref 1.7–7.7)
Neutrophils Relative %: 76 % (ref 43–77)
Platelets: 153 10*3/uL (ref 150–400)
RBC: 4.61 MIL/uL (ref 4.22–5.81)
RDW: 13.4 % (ref 11.5–15.5)
WBC: 9.7 10*3/uL (ref 4.0–10.5)

## 2015-03-24 LAB — CBG MONITORING, ED: Glucose-Capillary: 74 mg/dL (ref 65–99)

## 2015-03-24 LAB — URIC ACID: URIC ACID, SERUM: 11.2 mg/dL — AB (ref 4.4–7.6)

## 2015-03-24 MED ORDER — PREDNISONE 10 MG PO TABS: ORAL_TABLET | ORAL | Status: DC

## 2015-03-24 MED ORDER — OXYCODONE-ACETAMINOPHEN 5-325 MG PO TABS
1.0000 | ORAL_TABLET | Freq: Once | ORAL | Status: AC
  Administered 2015-03-24: 1 via ORAL
  Filled 2015-03-24: qty 1

## 2015-03-24 MED ORDER — PREDNISONE 50 MG PO TABS
60.0000 mg | ORAL_TABLET | Freq: Once | ORAL | Status: AC
  Administered 2015-03-24: 60 mg via ORAL
  Filled 2015-03-24 (×2): qty 1

## 2015-03-24 MED ORDER — HYDROCODONE-ACETAMINOPHEN 5-325 MG PO TABS: ORAL_TABLET | ORAL | Status: DC

## 2015-03-24 NOTE — ED Notes (Signed)
Pain to left foot for last 2-3 weeks and pain is increasing.  Rates pain 9/10.  Did not take any pain medication today.

## 2015-03-24 NOTE — Discharge Instructions (Signed)
Gout Gout is when your joints become red, sore, and swell (inflamed). This is caused by the buildup of uric acid crystals in the joints. Uric acid is a chemical that is normally in the blood. If the level of uric acid gets too high in the blood, these crystals form in your joints and tissues. Over time, these crystals can form into masses near the joints and tissues. These masses can destroy bone and cause the bone to look misshapen (deformed). HOME CARE   Do not take aspirin for pain.  Only take medicine as told by your doctor.  Rest the joint as much as you can. When in bed, keep sheets and blankets off painful areas.  Keep the sore joints raised (elevated).  Put warm or cold packs on painful joints. Use of warm or cold packs depends on which works best for you.  Use crutches if the painful joint is in your leg.  Drink enough fluids to keep your pee (urine) clear or pale yellow. Limit alcohol, sugary drinks, and drinks with fructose in them.  Follow your diet instructions. Pay careful attention to how much protein you eat. Include fruits, vegetables, whole grains, and fat-free or low-fat milk products in your daily diet. Talk to your doctor or dietitian about the use of coffee, vitamin C, and cherries. These may help lower uric acid levels.  Keep a healthy body weight. GET HELP RIGHT AWAY IF:   You have watery poop (diarrhea), throw up (vomit), or have any side effects from medicines.  You do not feel better in 24 hours, or you are getting worse.  Your joint becomes suddenly more tender, and you have chills or a fever. MAKE SURE YOU:   Understand these instructions.  Will watch your condition.  Will get help right away if you are not doing well or get worse. Document Released: 06/07/2008 Document Revised: 01/13/2014 Document Reviewed: 04/11/2012 [facility] Patient Information 2015 [place], [place]. This information is not intended to replace advice given to you by your health care  provider. Make sure you discuss any questions you have with your health care provider.  Low-Purine Diet Purines are compounds that affect the level of uric acid in your body. A low-purine diet is a diet that is low in purines. Eating a low-purine diet can prevent the level of uric acid in your body from getting too high and causing gout or kidney stones or both. WHAT DO I NEED TO KNOW ABOUT THIS DIET?  Choose low-purine foods. Examples of low-purine foods are listed in the next section.  Drink plenty of fluids, especially water. Fluids can help remove uric acid from your body. Try to drink 8-16 cups (1.9-3.8 L) a day.  Limit foods high in fat, especially saturated fat, as fat makes it harder for the body to get rid of uric acid. Foods high in saturated fat include pizza, cheese, ice cream, whole milk, fried foods, and gravies. Choose foods that are lower in fat and lean sources of protein. Use olive oil when cooking as it contains healthy fats that are not high in saturated fat.  Limit alcohol. Alcohol interferes with the elimination of uric acid from your body. If you are having a gout attack, avoid all alcohol.  Keep in mind that different people's bodies react differently to different foods. You will probably learn over time which foods do or do not affect you. If you discover that a food tends to cause your gout to flare up, avoid eating that  food. You can more freely enjoy foods that do not cause problems. If you have any questions about a food item, talk to your dietitian or health care provider. WHICH FOODS ARE LOW, MODERATE, AND HIGH IN PURINES? The following is a list of foods that are low, moderate, and high in purines. You can eat any amount of the foods that are low in purines. You may be able to have small amounts of foods that are moderate in purines. Ask your health care provider how much of a food moderate in purines you can have. Avoid foods high in purines. Grains  Foods low in  purines: Enriched white bread, pasta, rice, cake, cornbread, popcorn.  Foods moderate in purines: Whole-grain breads and cereals, wheat germ, bran, oatmeal. Uncooked oatmeal. Dry wheat bran or wheat germ.  Foods high in purines: Pancakes, [place] toast, biscuits, muffins. Vegetables  Foods low in purines: All vegetables, except those that are moderate in purines.  Foods moderate in purines: Asparagus, cauliflower, spinach, mushrooms, green peas. Fruits  All fruits are low in purines. Meats and other Protein Foods  Foods low in purines: Eggs, nuts, peanut butter.  Foods moderate in purines: 80-90% lean beef, lamb, veal, pork, poultry, fish, eggs, peanut butter, nuts. Crab, lobster, oysters, and shrimp. Cooked dried beans, peas, and lentils.  Foods high in purines: Anchovies, sardines, herring, mussels, tuna, codfish, scallops, trout, and haddock. Berniece Salines. Organ meats (such as liver or kidney). Tripe. Game meat. Goose. Sweetbreads. Dairy  All dairy foods are low in purines. Low-fat and fat-free dairy products are best because they are low in saturated fat. Beverages  Drinks low in purines: Water, carbonated beverages, tea, coffee, cocoa.  Drinks moderate in purines: Soft drinks and other drinks sweetened with high-fructose corn syrup. Juices. To find whether a food or drink is sweetened with high-fructose corn syrup, look at the ingredients list.  Drinks high in purines: Alcoholic beverages (such as beer). Condiments  Foods low in purines: Salt, herbs, olives, pickles, relishes, vinegar.  Foods moderate in purines: Butter, margarine, oils, mayonnaise. Fats and Oils  Foods low in purines: All types, except gravies and sauces made with meat.  Foods high in purines: Gravies and sauces made with meat. Other Foods  Foods low in purines: Sugars, sweets, gelatin. Cake. Soups made without meat.  Foods moderate in purines: Meat-based or fish-based soups, broths, or bouillons. Foods and  drinks sweetened with high-fructose corn syrup.  Foods high in purines: High-fat desserts (such as ice cream, cookies, cakes, pies, doughnuts, and chocolate). Contact your dietitian for more information on foods that are not listed here. Document Released: 12/24/2010 Document Revised: 09/03/2013 Document Reviewed: 08/05/2013 [facility] Patient Information 2015 [place], [place]. This information is not intended to replace advice given to you by your health care provider. Make sure you discuss any questions you have with your health care provider.
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
## 2015-04-16 ENCOUNTER — Encounter: Payer: Self-pay | Admitting: Gastroenterology

## 2015-04-16 NOTE — Progress Notes (Signed)
Called. Many rings and no answer.  

## 2015-04-16 NOTE — Progress Notes (Signed)
Operative notes from Aug 2015 reviewed. Colonoscopy by Dr. Jannette Fogo: normal ileum. Moderately severe diverticulosis in sigmoid and descending colon, single sessile polyp in sigmoid, 64mm in size, tubular adenoma.   If he is still having low volume rectal bleeding, can trial supportive measures such as Anusol. He is due for surveillance in 2020, barring any clinical changes.

## 2015-04-29 NOTE — Progress Notes (Signed)
Tried to call x 2 and it would not ring but once each time. Mailing a letter to call.

## 2015-08-03 ENCOUNTER — Telehealth: Payer: Self-pay

## 2015-08-03 NOTE — Telephone Encounter (Signed)
Pt came by the office, said he received a letter to call in 04/2015. Alan Vasquez, please see addendum  dated 04/16/2015. Pt states he is still having some bright red blood per rectum every other day. He has 2-3 BM's daily, mostly soft, sometimes diarrhea. He is adamant that he needs another colonoscopy. He gave me his brother's phone numbers. Nikki Dom [phone]    Cell  [phone] Also another contact is his nephew  Alethia Berthold  [phone]   [facility] lives with the pt).  Alan Vasquez, please advise!

## 2015-08-04 ENCOUNTER — Encounter (HOSPITAL_COMMUNITY): Payer: Self-pay | Admitting: Emergency Medicine

## 2015-08-04 ENCOUNTER — Emergency Department (HOSPITAL_COMMUNITY): Payer: Non-veteran care

## 2015-08-04 ENCOUNTER — Emergency Department (HOSPITAL_COMMUNITY)
Admission: EM | Admit: 2015-08-04 | Discharge: 2015-08-04 | Disposition: A | Discharge status: Home / Self Care | Payer: Non-veteran care | Attending: Emergency Medicine | Admitting: Emergency Medicine

## 2015-08-04 DIAGNOSIS — E7800 Pure hypercholesterolemia, unspecified: Secondary | ICD-10-CM | POA: Diagnosis not present

## 2015-08-04 DIAGNOSIS — I10 Essential (primary) hypertension: Secondary | ICD-10-CM | POA: Diagnosis not present

## 2015-08-04 DIAGNOSIS — Y998 Other external cause status: Secondary | ICD-10-CM | POA: Diagnosis not present

## 2015-08-04 DIAGNOSIS — Z79899 Other long term (current) drug therapy: Secondary | ICD-10-CM | POA: Insufficient documentation

## 2015-08-04 DIAGNOSIS — Z9889 Other specified postprocedural states: Secondary | ICD-10-CM | POA: Insufficient documentation

## 2015-08-04 DIAGNOSIS — X58XXXA Exposure to other specified factors, initial encounter: Secondary | ICD-10-CM | POA: Insufficient documentation

## 2015-08-04 DIAGNOSIS — I2510 Atherosclerotic heart disease of native coronary artery without angina pectoris: Secondary | ICD-10-CM | POA: Insufficient documentation

## 2015-08-04 DIAGNOSIS — Z7982 Long term (current) use of aspirin: Secondary | ICD-10-CM | POA: Insufficient documentation

## 2015-08-04 DIAGNOSIS — K219 Gastro-esophageal reflux disease without esophagitis: Secondary | ICD-10-CM | POA: Insufficient documentation

## 2015-08-04 DIAGNOSIS — Y9289 Other specified places as the place of occurrence of the external cause: Secondary | ICD-10-CM | POA: Diagnosis not present

## 2015-08-04 DIAGNOSIS — Y9389 Activity, other specified: Secondary | ICD-10-CM | POA: Diagnosis not present

## 2015-08-04 DIAGNOSIS — J40 Bronchitis, not specified as acute or chronic: Secondary | ICD-10-CM

## 2015-08-04 DIAGNOSIS — Z87891 Personal history of nicotine dependence: Secondary | ICD-10-CM | POA: Diagnosis not present

## 2015-08-04 DIAGNOSIS — R05 Cough: Secondary | ICD-10-CM | POA: Diagnosis present

## 2015-08-04 DIAGNOSIS — Z951 Presence of aortocoronary bypass graft: Secondary | ICD-10-CM | POA: Insufficient documentation

## 2015-08-04 DIAGNOSIS — J209 Acute bronchitis, unspecified: Secondary | ICD-10-CM | POA: Diagnosis not present

## 2015-08-04 DIAGNOSIS — S93402A Sprain of unspecified ligament of left ankle, initial encounter: Secondary | ICD-10-CM | POA: Insufficient documentation

## 2015-08-04 MED ORDER — AZITHROMYCIN 250 MG PO TABS: 250.0000 mg | ORAL_TABLET | Freq: Every day | ORAL | Status: DC

## 2015-08-04 MED ORDER — PREDNISONE 10 MG PO TABS: 20.0000 mg | ORAL_TABLET | Freq: Two times a day (BID) | ORAL | Status: DC

## 2015-08-04 MED ORDER — GUAIFENESIN-CODEINE 100-10 MG/5ML PO SYRP: 5.0000 mL | ORAL_SOLUTION | Freq: Three times a day (TID) | ORAL | Status: DC | PRN

## 2015-08-04 MED ORDER — HYDROCODONE-ACETAMINOPHEN 5-325 MG PO TABS
1.0000 | ORAL_TABLET | Freq: Once | ORAL | Status: AC
  Administered 2015-08-04: 1 via ORAL
  Filled 2015-08-04: qty 1

## 2015-08-04 MED ORDER — PREDNISONE 50 MG PO TABS
60.0000 mg | ORAL_TABLET | Freq: Once | ORAL | Status: AC
  Administered 2015-08-04: 60 mg via ORAL
  Filled 2015-08-04: qty 1

## 2015-08-04 MED ORDER — IPRATROPIUM-ALBUTEROL 0.5-2.5 (3) MG/3ML IN SOLN
3.0000 mL | Freq: Once | RESPIRATORY_TRACT | Status: AC
  Administered 2015-08-04: 3 mL via RESPIRATORY_TRACT
  Filled 2015-08-04: qty 3

## 2015-08-04 NOTE — ED Notes (Signed)
Pt reports cough and L ankle pain that started 3-4 days ago. Pt states he has "twisted his L ankle several times."

## 2015-08-04 NOTE — Telephone Encounter (Signed)
He needs an office visit.  

## 2015-08-04 NOTE — Telephone Encounter (Signed)
I called and left message with his brother Jeneen Rinks to have him call to schedule OV.
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
## 2015-08-04 NOTE — Discharge Instructions (Signed)
Use your inhaler as needed and take the medications we give you as directed. Return as needed for worsening symptoms.

## 2015-08-12 NOTE — Telephone Encounter (Signed)
Pt is scheduled for an OV with AS on 08/28/2015 at 10:30 AM.

## 2015-08-28 ENCOUNTER — Ambulatory Visit (INDEPENDENT_AMBULATORY_CARE_PROVIDER_SITE_OTHER): Payer: Non-veteran care | Admitting: Gastroenterology

## 2015-08-28 ENCOUNTER — Encounter: Payer: Self-pay | Admitting: Gastroenterology

## 2015-08-28 VITALS — BP 106/62 | HR 65 | Temp 97.5°F | Ht 67.0 in | Wt 201.2 lb

## 2015-08-28 DIAGNOSIS — K625 Hemorrhage of anus and rectum: Secondary | ICD-10-CM

## 2015-08-28 MED ORDER — HYDROCORTISONE 2.5 % RE CREA: 1.0000 "application " | TOPICAL_CREAM | Freq: Two times a day (BID) | RECTAL | Status: DC
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
## 2015-08-28 NOTE — Patient Instructions (Signed)
Use the anusol cream twice a day per rectum for 7 days.   We have scheduled you for a hemorrhoid banding with Dr. Oneida Alar in the near future.  Your next colonoscopy will be in 2020 unless things change.

## 2015-08-28 NOTE — Assessment & Plan Note (Addendum)
69 year old male with history of an adenoma last year on outside colonoscopy, now with low-volume hematochezia intermittently and no evidence of anemia or other concerning factors. Rectal exam without obvious external abnormality and internal exam without mass. Likely will benefit from outpatient hemorrhoid banding. Will arrange this with Dr. Oneida Alar in the near future. Next colonoscopy due in 2020 unless persistent rectal bleeding or other concerning clinical signs. Anusol cream in interim.

## 2015-08-31 NOTE — Progress Notes (Signed)
No pcp per patient 

## 2015-09-30 ENCOUNTER — Encounter: Payer: Non-veteran care | Admitting: Gastroenterology

## 2015-09-30 ENCOUNTER — Ambulatory Visit (INDEPENDENT_AMBULATORY_CARE_PROVIDER_SITE_OTHER): Payer: Non-veteran care | Admitting: Gastroenterology

## 2015-09-30 ENCOUNTER — Other Ambulatory Visit: Payer: Self-pay

## 2015-09-30 ENCOUNTER — Encounter: Payer: Self-pay | Admitting: Gastroenterology

## 2015-09-30 VITALS — BP 137/73 | HR 80 | Temp 96.9°F | Ht 67.0 in | Wt 206.4 lb

## 2015-09-30 DIAGNOSIS — K625 Hemorrhage of anus and rectum: Secondary | ICD-10-CM

## 2015-09-30 MED ORDER — HYDROCORTISONE 2.5 % RE CREA: 1.0000 "application " | TOPICAL_CREAM | Freq: Two times a day (BID) | RECTAL | Status: DC

## 2015-09-30 NOTE — Patient Instructions (Addendum)
USE ANUSOL TWICE DAILY FOR 10 DAYS.   DRINK WATER TO KEEP YOUR URINE LIGHT YELLOW.  FOLLOW A HIGH FIBER DIET. AVOID ITEMS THAT CAUSE BLOATING & GAS. SEE INFO BELOW.  Continue to take YOUR LAXATIVE every other day TO AVOID CONSTIPATION.  FLEXIBLE SIGMOIDOSCOPY WITH HEMORRHOID BANDING NEXT TUES JAN 24.  FOLLOW UP IN 3 MOS.   High-Fiber Diet A high-fiber diet changes your normal diet to include more whole grains, legumes, fruits, and vegetables. Changes in the diet involve replacing refined carbohydrates with unrefined foods. The calorie level of the diet is essentially unchanged. The Dietary Reference Intake (recommended amount) for adult males is 38 grams per day. For adult females, it is 25 grams per day. Pregnant and lactating women should consume 28 grams of fiber per day. Fiber is the intact part of a plant that is not broken down during digestion. Functional fiber is fiber that has been isolated from the plant to provide a beneficial effect in the body. PURPOSE  Increase stool bulk.   Ease and regulate bowel movements.   Lower cholesterol.  REDUCE RISK OF COLON CANCER  INDICATIONS THAT YOU NEED MORE FIBER  Constipation and hemorrhoids.   Uncomplicated diverticulosis (intestine condition) and irritable bowel syndrome.   Weight management.   As a protective measure against hardening of the arteries (atherosclerosis), diabetes, and cancer.   GUIDELINES FOR INCREASING FIBER IN THE DIET  Start adding fiber to the diet slowly. A gradual increase of about 5 more grams (2 slices of whole-wheat bread, 2 servings of most fruits or vegetables, or 1 bowl of high-fiber cereal) per day is best. Too rapid an increase in fiber may result in constipation, flatulence, and bloating.   Drink enough water and fluids to keep your urine clear or pale yellow. Water, juice, or caffeine-free drinks are recommended. Not drinking enough fluid may cause constipation.   Eat a variety of high-fiber  foods rather than one type of fiber.   Try to increase your intake of fiber through using high-fiber foods rather than fiber pills or supplements that contain small amounts of fiber.   The goal is to change the types of food eaten. Do not supplement your present diet with high-fiber foods, but replace foods in your present diet.   INCLUDE A VARIETY OF FIBER SOURCES  Replace refined and processed grains with whole grains, canned fruits with fresh fruits, and incorporate other fiber sources. White rice, white breads, and most bakery goods contain little or no fiber.   Brown whole-grain rice, buckwheat oats, and many fruits and vegetables are all good sources of fiber. These include: broccoli, Brussels sprouts, cabbage, cauliflower, beets, sweet potatoes, white potatoes (skin on), carrots, tomatoes, eggplant, squash, berries, fresh fruits, and dried fruits.   Cereals appear to be the richest source of fiber. Cereal fiber is found in whole grains and bran. Bran is the fiber-rich outer coat of cereal grain, which is largely removed in refining. In whole-grain cereals, the bran remains. In breakfast cereals, the largest amount of fiber is found in those with "bran" in their names. The fiber content is sometimes indicated on the label.   You may need to include additional fruits and vegetables each day.   In baking, for 1 cup white flour, you may use the following substitutions:   1 cup whole-wheat flour minus 2 tablespoons.   1/2 cup white flour plus 1/2 cup whole-wheat flour.
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
## 2015-09-30 NOTE — Assessment & Plan Note (Addendum)
SYMPTOMS NOT CONTROLLED. MOST LIKELY DUE TO HEMORRHOIDS, LESS LIKELY ANAL LESION OR COLON POLYP.  USE ANUSOL TWICE DAILY FOR 10 DAYS.  DRINK WATER TO KEEP YOUR URINE LIGHT YELLOW. FOLLOW A HIGH FIBER DIET. AVOID ITEMS THAT CAUSE BLOATING & GAS. SEE INFO BELOW. Continue to take YOUR LAXATIVE every other day TO AVOID CONSTIPATION. DISCUSSED PROCEDURE, BENEFITS, RISKS, AND MANAGEMENT OF HEMORRHOIDS: RH BANDINIG V. ENDOSCOPIC BANDING. PT ELECTED TO HAVE FLEX SIG. FLEXIBLE SIGMOIDOSCOPY/POSSIBLE HEMORRHOID BANDING FEB 5-DISCUSSED PROCEDURE, BENEFITS, & RISKS: < 1% chance of medication reaction, PERFORATION, PELVIC VEIN SEPSIS, OR bleeding. FOLLOW UP IN 3 MOS.

## 2015-10-06 ENCOUNTER — Ambulatory Visit (HOSPITAL_COMMUNITY)
Admission: RE | Admit: 2015-10-06 | Discharge: 2015-10-06 | Disposition: A | Discharge status: Home / Self Care | Payer: Non-veteran care | Source: Ambulatory Visit | Attending: Gastroenterology | Admitting: Gastroenterology

## 2015-10-06 ENCOUNTER — Encounter (HOSPITAL_COMMUNITY)
Admission: RE | Disposition: A | Discharge status: Home / Self Care | Payer: Self-pay | Source: Ambulatory Visit | Attending: Gastroenterology

## 2015-10-06 ENCOUNTER — Encounter (HOSPITAL_COMMUNITY): Payer: Self-pay | Admitting: *Deleted

## 2015-10-06 DIAGNOSIS — Z951 Presence of aortocoronary bypass graft: Secondary | ICD-10-CM | POA: Diagnosis not present

## 2015-10-06 DIAGNOSIS — K644 Residual hemorrhoidal skin tags: Secondary | ICD-10-CM | POA: Insufficient documentation

## 2015-10-06 DIAGNOSIS — Z79899 Other long term (current) drug therapy: Secondary | ICD-10-CM | POA: Insufficient documentation

## 2015-10-06 DIAGNOSIS — K921 Melena: Secondary | ICD-10-CM | POA: Diagnosis not present

## 2015-10-06 DIAGNOSIS — K625 Hemorrhage of anus and rectum: Secondary | ICD-10-CM | POA: Diagnosis not present

## 2015-10-06 DIAGNOSIS — I10 Essential (primary) hypertension: Secondary | ICD-10-CM | POA: Insufficient documentation

## 2015-10-06 DIAGNOSIS — E7800 Pure hypercholesterolemia, unspecified: Secondary | ICD-10-CM | POA: Diagnosis not present

## 2015-10-06 DIAGNOSIS — Z7982 Long term (current) use of aspirin: Secondary | ICD-10-CM | POA: Diagnosis not present

## 2015-10-06 DIAGNOSIS — K649 Unspecified hemorrhoids: Secondary | ICD-10-CM

## 2015-10-06 DIAGNOSIS — D125 Benign neoplasm of sigmoid colon: Secondary | ICD-10-CM | POA: Insufficient documentation

## 2015-10-06 DIAGNOSIS — I2510 Atherosclerotic heart disease of native coronary artery without angina pectoris: Secondary | ICD-10-CM | POA: Insufficient documentation

## 2015-10-06 DIAGNOSIS — K648 Other hemorrhoids: Secondary | ICD-10-CM | POA: Insufficient documentation

## 2015-10-06 DIAGNOSIS — K5730 Diverticulosis of large intestine without perforation or abscess without bleeding: Secondary | ICD-10-CM | POA: Insufficient documentation

## 2015-10-06 HISTORY — PX: FLEXIBLE SIGMOIDOSCOPY: SHX5431

## 2015-10-06 HISTORY — PX: HEMORRHOID BANDING: SHX5850

## 2015-10-06 SURGERY — SIGMOIDOSCOPY, FLEXIBLE
Anesthesia: Moderate Sedation

## 2015-10-06 MED ORDER — MEPERIDINE HCL 100 MG/ML IJ SOLN
INTRAMUSCULAR | Status: AC
  Filled 2015-10-06: qty 2

## 2015-10-06 MED ORDER — SIMETHICONE 40 MG/0.6ML PO SUSP
ORAL | Status: DC | PRN
  Administered 2015-10-06: 15:00:00

## 2015-10-06 MED ORDER — SODIUM CHLORIDE 0.9 % IV SOLN
INTRAVENOUS | Status: DC
  Administered 2015-10-06: 15:00:00 via INTRAVENOUS

## 2015-10-06 MED ORDER — MEPERIDINE HCL 100 MG/ML IJ SOLN
INTRAMUSCULAR | Status: DC | PRN
  Administered 2015-10-06 (×2): 25 mg via INTRAVENOUS

## 2015-10-06 MED ORDER — MIDAZOLAM HCL 5 MG/5ML IJ SOLN
INTRAMUSCULAR | Status: DC | PRN
  Administered 2015-10-06 (×2): 2 mg via INTRAVENOUS

## 2015-10-06 MED ORDER — MIDAZOLAM HCL 5 MG/5ML IJ SOLN
INTRAMUSCULAR | Status: AC
  Filled 2015-10-06: qty 10
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
## 2015-10-06 NOTE — Op Note (Addendum)
[facility] [address] [place], [address]   FLEX SIGMOIDOSCOPY PROCEDURE REPORT  PATIENT: Alan Vasquez, Alan Vasquez  MR#: [id number] BIRTHDATE: [birth year] , 4  yrs. old GENDER: male ENDOSCOPIST: Danie Binder, MD REFERRED BY: PROCEDURE DATE:  2015-10-08 PROCEDURE:   Sigmoidoscopy with snare and Hemorrhoidectomy via banding, clips or ligation INDICATIONS:hematochezia.  TCS 2015: ONE 5 MM SIMPLE ADENOMA(Dr. London Pepper) MEDICATIONS: Demerol 50 mg IV and Versed 4 mg IV  MD INITIATED SEDATION: 1522. PROCEDURE END: 1545  DESCRIPTION OF PROCEDURE:    Physical exam was performed.  Informed consent was obtained from the patient after explaining the benefits, risks, and alternatives to procedure.  The patient was connected to monitor and placed in left lateral position. Continuous oxygen was provided by nasal cannula and IV medicine administered through an indwelling cannula.  After administration of sedation and rectal exam, the patients rectum was intubated and the [id number]  colonoscope was advanced under direct visualization to the cecum.  The scope was removed slowly by carefully examining the color, texture, anatomy, and integrity mucosa on the way out.  The patient was recovered in endoscopy and discharged home in satisfactory condition. Estimated blood loss is zero unless otherwise noted in this procedure report.    COLON FINDINGS: There was moderate diverticulosis noted in the sigmoid colon with associated muscular hypertrophy. ONE 5 MM SESSILE SIGMOID COLONPOLYP REMOVED VIA SNARE CAUTERY., Large internal hemorrhoids were found.  , and Moderate sized external hemorrhoids were found.  PREP QUALITY: The overall prep quality was adequate.COMPLICATIONS: None  ENDOSCOPIC IMPRESSION: 1.   Moderate diverticulosis in the sigmoid colon 2.   Large internal hemorrhoids 3.   Moderate sized external hemorrhoids  RECOMMENDATIONS: 1.  HOLD ASPIRIN FOR 7 DAYS.  YOU  may use ibuprofen 200 MG over-the-counter 2 every 6 hours as needed for mild rectal pain OR TYLENOL.   Take the ibuprofen with food and milk. 2  Use sitz bath 3 times a day OR ANUSOL CREAM AS NEEDED FOR RECTAL PAIN. 3.  Avoid straining to have bowel movements. 4.  Keep anal area dry and clean. 5.  Do not use a donut shaped pillow or sit on the toilet for long periods.  This increases blood pooling and pain. 6.  Move your bowels when your body has the urge; this will require less straining and will decrease pain and pressure. 7.  IF NEEDED, Add Colace 100 mg twice daily FOR 7 DAYS to soften the stool.  HOLD FOR DIARRHEA. 8.  RESUME REGULAR DIET.  DRINK WATER TO KEEP URINE LIGHT YELLOW.   eSigned:  Danie Binder, MD 10/08/15 4:13 PM Revised: 10-08-2015 4:13 PM  CPT CODES: ICD CODES:  The ICD and CPT codes recommended by this software are interpretations from the data that the clinical staff has captured with the software.  The verification of the translation of this report to the ICD and CPT codes and modifiers is the sole responsibility of the health care institution and practicing physician where this report was generated.  [employer] will not be held responsible for the validity of the ICD and CPT codes included on this report.  AMA assumes no liability for data contained or not contained herein. CPT is a Designer, television/film set of the [employer].

## 2015-10-06 NOTE — Discharge Instructions (Signed)
HEMORRHOIDAL BANDING DISCHARGE INSTRUCTIONS  I REMOVED ONE colon POLYP. I PUT BANDS IN 3 AREAS ABOVE YOUR HEMORRHOIDS. PLEASE CALL [phone] FOR FOR RECTAL BLEEDING, FEVER, PAIN, OR DIFFICULTY URINATING. YOU MAY SEE BLEEDING FOR 3 DAYS TO 2 WEEKS.    HOME CARE INSTRUCTIONS  FOLLOWING YOUR PROCEDURE IT IS COMMON TO HAVE MINOR RECTAL DISCOMFORT OR PAIN. 1.  HOLD ASPIRIN FOR 7 DAYS. YOU may use ibuprofen 200 MG over-the-counter 2 every 6 hours as needed for mild rectal pain OR TYLENOL.   Take the ibuprofen with food and milk.  2   Use sitz bath 3 times a day  OR ANUSOL CREAM AS NEEDED FOR RECTAL PAIN.   3. Avoid straining to have bowel movements. 4. Keep anal area dry and clean.   5. Do not use a donut shaped pillow or sit on the toilet for long periods. This increases blood pooling and pain.  6. Move your bowels when your body has the urge; this will require less straining and will decrease pain and pressure.   7. IF NEEDED, Add Colace 100 mg twice daily FOR 7 DAYS to soften the stool. HOLD FOR DIARRHEA. 8. RESUME YOUR REGULAR DIET. DRINK WATER TO KEEP YOUR URINE LIGHT YELLOW.  9. If you have a simple adenoma removed, you will need a colonoscopy.         SIGMOIDOSCOPY Care After Read the instructions outlined below and refer to this sheet in the next week. These discharge instructions provide you with general information on caring for yourself after you leave the hospital. While your treatment has been planned according to the most current medical practices available, unavoidable complications occasionally occur. If you have any problems or questions after discharge, call DR. Ricardo Kayes, [phone].  ACTIVITY  You may resume your regular activity, but move at a slower pace for the next 24 hours.   Take frequent rest periods for the next 24 hours.   Walking will help get rid of the air and reduce the bloated feeling in your belly (abdomen).   No driving for 24 hours (because of  the medicine (anesthesia) used during the test).   You may shower.   Do not sign any important legal documents or operate any machinery for 24 hours (because of the anesthesia used during the test).    NUTRITION  Drink plenty of fluids.   You may resume your normal diet as instructed by your doctor.   Begin with a light meal and progress to your normal diet. Heavy or fried foods are harder to digest and may make you feel sick to your stomach (nauseated).   Avoid alcoholic beverages for 24 hours or as instructed.    MEDICATIONS  You may resume your normal medications.   WHAT YOU CAN EXPECT TODAY  Some feelings of bloating in the abdomen.   Passage of more gas than usual.   Spotting of blood in your stool or on the toilet paper  .  IF YOU HAD POLYPS REMOVED DURING THE COLONOSCOPY:  Eat a soft diet IF YOU HAVE NAUSEA, BLOATING, ABDOMINAL PAIN, OR VOMITING.    FINDING OUT THE RESULTS OF YOUR TEST Not all test results are available during your visit. DR. Oneida Alar WILL CALL YOU WITHIN 14 DAYS OF YOUR PROCEDUE WITH YOUR RESULTS. Do not assume everything is normal if you have not heard from DR. Philomena Buttermore, CALL HER OFFICE AT [phone].  SEEK IMMEDIATE MEDICAL ATTENTION AND CALL THE OFFICE: [phone] IF:  You have more than a  spotting of blood in your stool.   Your belly is swollen (abdominal distention).   You are nauseated or vomiting.   You have a temperature over 101F.   You have abdominal pain or discomfort that is severe or gets worse throughout the day.    HEMORRHOIDAL BANDING COMPLICATIONS:  COMMON: 1. MINOR PAIN  UNCOMMON: 1. ABSCESS 2. BAND FALLS OFF 3. PROLAPSE OF HEMORRHOIDS AND PAIN 4. ULCER BLEEDING  A. USUALLY SELF-LIMITED: MAY LAST 3-5 DAYS  B. MAY REQUIRE INTERVENTION: 1-2 WEEKS AFTER INTERACTIONS 5. NECROTIZING PELVIC SEPSIS  A. SYMPTOMS: FEVER, PAIN, DIFFICULTY URINATING

## 2015-10-06 NOTE — Progress Notes (Signed)
REVIEWED-NO ADDITIONAL RECOMMENDATIONS. 

## 2015-10-09 ENCOUNTER — Encounter (HOSPITAL_COMMUNITY): Payer: Self-pay | Admitting: Gastroenterology

## 2015-10-26 ENCOUNTER — Telehealth: Payer: Self-pay | Admitting: Gastroenterology

## 2015-10-26 NOTE — Telephone Encounter (Signed)
Please call pt. He had A simple adenoma removed. YOU NEED A COMPLETE COLONOSCOPY WITHIN THE NEXT 3 MOS.

## 2015-10-27 NOTE — Telephone Encounter (Signed)
Called pt and LM with family member for him to call office

## 2015-10-27 NOTE — Telephone Encounter (Signed)
Reminder in epic °

## 2015-10-30 NOTE — Telephone Encounter (Signed)
LM for pt to call. Mailed letter to call also.

## 2015-10-30 NOTE — Telephone Encounter (Signed)
Mailed pt letter

## 2015-11-02 NOTE — Telephone Encounter (Signed)
Pt called and was given results

## 2015-11-04 ENCOUNTER — Encounter (HOSPITAL_COMMUNITY): Payer: Self-pay | Admitting: Emergency Medicine

## 2015-11-04 ENCOUNTER — Emergency Department (HOSPITAL_COMMUNITY): Payer: Non-veteran care

## 2015-11-04 ENCOUNTER — Emergency Department (HOSPITAL_COMMUNITY)
Admission: EM | Admit: 2015-11-04 | Discharge: 2015-11-04 | Disposition: A | Discharge status: Home / Self Care | Payer: Non-veteran care | Attending: Emergency Medicine | Admitting: Emergency Medicine

## 2015-11-04 DIAGNOSIS — I10 Essential (primary) hypertension: Secondary | ICD-10-CM | POA: Insufficient documentation

## 2015-11-04 DIAGNOSIS — I2510 Atherosclerotic heart disease of native coronary artery without angina pectoris: Secondary | ICD-10-CM | POA: Diagnosis not present

## 2015-11-04 DIAGNOSIS — R5383 Other fatigue: Secondary | ICD-10-CM

## 2015-11-04 DIAGNOSIS — Z87891 Personal history of nicotine dependence: Secondary | ICD-10-CM | POA: Insufficient documentation

## 2015-11-04 DIAGNOSIS — R531 Weakness: Secondary | ICD-10-CM | POA: Diagnosis present

## 2015-11-04 DIAGNOSIS — Z79899 Other long term (current) drug therapy: Secondary | ICD-10-CM | POA: Insufficient documentation

## 2015-11-04 DIAGNOSIS — N289 Disorder of kidney and ureter, unspecified: Secondary | ICD-10-CM | POA: Insufficient documentation

## 2015-11-04 DIAGNOSIS — Z951 Presence of aortocoronary bypass graft: Secondary | ICD-10-CM | POA: Insufficient documentation

## 2015-11-04 HISTORY — DX: Hemorrhage of anus and rectum: K62.5

## 2015-11-04 HISTORY — DX: Diverticulosis of intestine, part unspecified, without perforation or abscess without bleeding: K57.90

## 2015-11-04 HISTORY — DX: Unspecified hemorrhoids: K64.9

## 2015-11-04 LAB — CBC WITH DIFFERENTIAL/PLATELET
BASOS ABS: 0 10*3/uL (ref 0.0–0.1)
BASOS PCT: 0 %
EOS ABS: 0.2 10*3/uL (ref 0.0–0.7)
EOS PCT: 3 %
HCT: 37.2 % — ABNORMAL LOW (ref 39.0–52.0)
Hemoglobin: 12.8 g/dL — ABNORMAL LOW (ref 13.0–17.0)
Lymphocytes Relative: 10 %
Lymphs Abs: 0.6 10*3/uL — ABNORMAL LOW (ref 0.7–4.0)
MCH: 31.7 pg (ref 26.0–34.0)
MCHC: 34.4 g/dL (ref 30.0–36.0)
MCV: 92.1 fL (ref 78.0–100.0)
MONO ABS: 0.4 10*3/uL (ref 0.1–1.0)
MONOS PCT: 7 %
NEUTROS ABS: 4.6 10*3/uL (ref 1.7–7.7)
Neutrophils Relative %: 79 %
PLATELETS: 115 10*3/uL — AB (ref 150–400)
RBC: 4.04 MIL/uL — ABNORMAL LOW (ref 4.22–5.81)
RDW: 14 % (ref 11.5–15.5)
WBC: 5.8 10*3/uL (ref 4.0–10.5)

## 2015-11-04 LAB — URINALYSIS, ROUTINE W REFLEX MICROSCOPIC
BILIRUBIN URINE: NEGATIVE
GLUCOSE, UA: NEGATIVE mg/dL
Hgb urine dipstick: NEGATIVE
KETONES UR: NEGATIVE mg/dL
LEUKOCYTES UA: NEGATIVE
Nitrite: NEGATIVE
PH: 5.5 (ref 5.0–8.0)
PROTEIN: NEGATIVE mg/dL
Specific Gravity, Urine: 1.02 (ref 1.005–1.030)

## 2015-11-04 LAB — HEPATIC FUNCTION PANEL
ALBUMIN: 3.8 g/dL (ref 3.5–5.0)
ALT: 17 U/L (ref 17–63)
AST: 24 U/L (ref 15–41)
Alkaline Phosphatase: 60 U/L (ref 38–126)
BILIRUBIN TOTAL: 0.7 mg/dL (ref 0.3–1.2)
Bilirubin, Direct: <0.1 mg/dL — ABNORMAL LOW (ref 0.1–0.5)
TOTAL PROTEIN: 6.8 g/dL (ref 6.5–8.1)

## 2015-11-04 LAB — BASIC METABOLIC PANEL
ANION GAP: 6 (ref 5–15)
BUN: 42 mg/dL — ABNORMAL HIGH (ref 6–20)
CALCIUM: 8.4 mg/dL — AB (ref 8.9–10.3)
CO2: 26 mmol/L (ref 22–32)
CREATININE: 2.09 mg/dL — AB (ref 0.61–1.24)
Chloride: 107 mmol/L (ref 101–111)
GFR, EST AFRICAN AMERICAN: 36 mL/min — AB (ref >60)
GFR, EST NON AFRICAN AMERICAN: 31 mL/min — AB (ref >60)
Glucose, Bld: 193 mg/dL — ABNORMAL HIGH (ref 65–99)
Potassium: 4.1 mmol/L (ref 3.5–5.1)
SODIUM: 139 mmol/L (ref 135–145)

## 2015-11-04 LAB — LIPASE, BLOOD: LIPASE: 43 U/L (ref 11–51)

## 2015-11-04 LAB — TROPONIN I: TROPONIN I: 0.03 ng/mL (ref <0.031)

## 2015-11-04 LAB — POC OCCULT BLOOD, ED: FECAL OCCULT BLD: NEGATIVE

## 2015-11-04 MED ORDER — SODIUM CHLORIDE 0.9 % IV SOLN: INTRAVENOUS | Status: DC

## 2015-11-04 MED ORDER — SODIUM CHLORIDE 0.9 % IV BOLUS (SEPSIS)
500.0000 mL | Freq: Once | INTRAVENOUS | Status: AC
  Administered 2015-11-04: 500 mL via INTRAVENOUS

## 2015-11-04 NOTE — ED Notes (Signed)
Pt given sprite to drink. 

## 2015-11-04 NOTE — ED Notes (Signed)
Pt c/o continued weakness x 3 weeks since having a procedure done for internal abdominal bleeding. Pt reports n/v this am. Denies blood in vomit or stool. nad noted.
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
## 2015-11-04 NOTE — ED Notes (Signed)
Patient with no complaints at this time. Respirations even and unlabored. Skin warm/dry. Discharge instructions reviewed with patient at this time. Patient given opportunity to voice concerns/ask questions. IV removed per policy and band-aid applied to site. Patient discharged at this time and left Emergency Department with steady gait.  

## 2015-11-04 NOTE — Discharge Instructions (Signed)
°Emergency Department Resource Guide °1) Find a Doctor and Pay Out of Pocket °Although you won't have to find out who is covered by your insurance plan, it is a good idea to ask around and get recommendations. You will then need to call the office and see if the doctor you have chosen will accept you as a new patient and what types of options they offer for patients who are self-pay. Some doctors offer discounts or will set up payment plans for their patients who do not have insurance, but you will need to ask so you aren't surprised when you get to your appointment. ° °2) Contact Your Local Health Department °Not all health departments have doctors that can see patients for sick visits, but many do, so it is worth a call to see if yours does. If you don't know where your local health department is, you can check in your phone book. The CDC also has a tool to help you locate your state's health department, and many state websites also have listings of all of their local health departments. ° °3) Find a Walk-in Clinic °If your illness is not likely to be very severe or complicated, you may want to try a walk in clinic. These are popping up all over the country in pharmacies, drugstores, and shopping centers. They're usually staffed by nurse practitioners or physician assistants that have been trained to treat common illnesses and complaints. They're usually fairly quick and inexpensive. However, if you have serious medical issues or chronic medical problems, these are probably not your best option. ° °No Primary Care Doctor: °- Call [facility] at  832-8000 - they can help you locate a primary care doctor that  accepts your insurance, provides certain services, etc. °- Physician Referral Service- 1-800-533-3463 ° °Chronic Pain Problems: °Organization         Address  Phone   Notes  °[facility]  (336) 297-2271 Patients need to be referred by their primary care doctor.  ° °Medication  Assistance: °Organization         Address  Phone   Notes  °[place] [facility] [address] °[place], [place] [address] (336) 641-8030 --Must be a resident of [place] °-- Must have NO insurance coverage whatsoever (no Medicaid/ Medicare, etc.) °-- The pt. MUST have a primary care doctor that directs their care regularly and follows them in the community °  °[facility]  (866) 331-1348   °[employer]  (888) 892-1162   ° °Agencies that provide inexpensive medical care: °Organization         Address  Phone   Notes  °[facility]  (336) 832-8035   °[facility]    (336) 832-7272   °[facility] [address] °[place], [place] [address] (336) 832-4777   °[facility] [address], °[place] (336) 271-4999   °[facility]    (336) 373-0678   °[facility]    (336) 272-1050   °[facility] ° [address], [place] Phone:  (336) 832-4444, Fax:  (336) 832-4440 Hours of Operation:  9 am - 6 pm, M-F.  Also accepts Medicaid/Medicare and self-pay.  °[facility] ° [address], [place] Phone: (336) 832-3150, Fax: (336) 832-3151. Hours of Operation:  8:30 am - 5:30 pm, M-F.  Also accepts Medicaid and self-pay.  °[facility] [place] [address], [place] Phone: (336) 878-6027   °[facility] [address], [place], [place] (336)723-1848, Ext. 123 Mondays & Thursdays: 7-9 AM.  First 15 patients are seen on a first come, first serve basis. °  ° °Medicaid-accepting [place] Providers: ° °Organization         Address  Phone   Notes  °[facility] [address], [place] (336) 641-2100 Also accepts self-pay patients.  °[facility] [address], [place] ° (336) 856-9996   °[facility] [address], [place]  (336) 288-8857   °[facility] [address] [place] [address], [place] (336) 299-7000   °Veita Bland [address], [place]  ° (336) 373-1557 Only accepts [place] Access Medicaid patients after they have their name applied to their card.  ° °Self-Pay (no insurance) in [place]: ° °Organization         Address  Phone   Notes  °Sickle Cell Patients, [facility] [address], [place] (336) 832-1970   °[facility] [address], [place] (336) 832-4400   °[facility] ° [address], [place] (336) 992-4800   °[facility]/Dr. Osei-Bonsu ° [address] [place] [address], [place] or [address], [place] (336) 841-8500 Phone number for both [place] and [place] locations is the same.  °[facility] [address], [place] (336) 299-0000   °[facility] [address] [place] [address], [place] or [address] (336) 852-7530 °(336) 878-2260   °[facility] [address], [place], [place] (336) 350-1642, phone; (336) 294-5005, fax Sees patients 1st and 3rd Saturday of every month.  Must not qualify for public or private insurance (i.e. Medicaid, Medicare, [place] Health Choice, Veterans' Benefits) • Household income should be no more than 200% of the poverty level •The clinic cannot treat you if you are pregnant or think you are pregnant • Sexually transmitted diseases are not treated at the clinic.  ° ° °Dental Care: °Organization         Address  Phone  Notes  °[place] [facility] [address], [place] (336) 641-6152 Accepts children up to age 21 who are enrolled in Medicaid or [place] Health Choice; pregnant women with a Medicaid card; and children who have applied for Medicaid or [place] Health Choice, but were declined, whose parents can pay a reduced fee at time of service.  °[place]  [facility] [place]  [address], [place] (336) 641-7733 Accepts children up to age 21 who are enrolled in Medicaid or  Health Choice; pregnant women with a Medicaid card; and children who have applied for Medicaid or  Health Choice, but were declined, whose parents can pay a reduced fee at time of service.  °[facility] ° [address], [place] (336) 641-4533 Patients are seen by appointment only. Walk-ins are not accepted. [facility] will see patients 18 years of age and older. °Monday - Tuesday (8am-5pm) °Most Wednesdays (8:30-5pm) °$30 per visit, cash only  °[facility] ° [address], [place] (336) 641-4533 Patients are seen by appointment only. Walk-ins are not accepted. [facility] will see patients 18 years of age and older. °One   Wednesday Evening (Monthly: Volunteer Based).  $30 per visit, cash only  °[facility]  (919) 537-3737 for adults; Children under age 4, call Graduate Pediatric Dentistry at (919) [phone]. Children aged 4-14, please call (919) [phone] to request a pediatric application. ° Dental services are provided in all areas of dental care including fillings, crowns and bridges, complete and partial dentures, implants, gum treatment, root canals, and extractions. Preventive care is also provided. Treatment is provided to both adults and children. °Patients are selected via a lottery and there is often a waiting list. °  °[facility] [address], °[place] ° (336) 763-8833 www.drcivils.com °  °[facility] [address], [place], [place] (336)723-1848, Ext. 123 Second and Fourth Thursday of each month, opens at 6:30 AM; Clinic ends at 9 AM.  Patients are seen on a first-come first-served basis, and a limited number are seen during each clinic.  ° °[facility] ° [address], [place], [place] (336) 723-7904    Eligibility Requirements °You must have lived in [place], or [place] counties for at least the last three months. °  You cannot be eligible for state or federal sponsored healthcare insurance, including Veterans Administration, Medicaid, or Medicare. °  You generally cannot be eligible for healthcare insurance through your employer.  °  How to apply: °Eligibility screenings are held every Tuesday and Wednesday afternoon from 1:00 pm until 4:00 pm. You do not need an appointment for the interview!  °[facility] [address], [place], [place] 336-631-2330   °[facility]  336-342-8273   °[facility]  336-703-3100   °[facility]  336-570-6415   ° °Behavioral Health Resources in the Community: °Intensive Outpatient Programs °Organization         Address  Phone  Notes  °[facility] [address], [place], [place] 336-878-6098   °[facility] [address], [place], [place] 336-832-9800   °[facility] [address], [place], [place] ° 336-882-2125   °[facility] [address],  °[place], [place] 1-800-853-5163 or 336-641-4981   °Substance Abuse Resources °Organization         Address  Phone  Notes  °[facility]  336-882-2125   °[facility]  336-784-9470   °[facility]  336-285-9073   °[facility]  336-845-3988   °Residential & Outpatient Substance Abuse Program  1-800-659-3381   °Psychological Services °Organization         Address  Phone  Notes  °[facility]  336- 832-9600   °[facility]  336- 378-7881   °[facility] [address], [place] 1-800-853-5163 or 336-641-4981   ° °Mobile Crisis Teams °Organization         Address  Phone  Notes  °[facility], Mobile Crisis Care Unit  1-877-626-1772   °Assertive °[facility] ° [address]  [place], [place] 336-834-9664   °Sharon DeEsch [address] °[place] 336-554-5454   ° °Self-Help/Support Groups °Organization         Address  Phone             Notes  °[facility] [place] - variety of support groups  336- 373-1402 Call for more information  °Narcotics Anonymous (NA), [facility] [address], °[place]   2 meetings at this location  ° °  Residential Treatment Programs Organization         Address  Phone  Notes  [facility] [address],    [place]  1-972-156-8437   [facility]  [address], [place] [id number], [place], [place]   [place], [place] [phone] Admissions: 8am-3pm M-F  [facility] [place] [address],    [place], [place] [phone]   [facility] [address] [place], [place], [place]   [facility] [address],  [place], [place]   [facility] Intensive Outpatient [address], [place], [place]   [facility] [address]  [place], [place] 1-(438)284-0123 or [phone]   [facility] [address], [place], [place] Accepts Medicaid  [facility] [address],  [place] 1-7601355120 Substance Abuse/Addiction Treatment   [facility]         Address  Phone  Notes  [facility]  816-414-7536   Domenic Schwab, PhD [address] [place], [place]   5310050654 or 703-015-7217   [facility] [place], [place] [phone]   [facility] [address] [place], [place] [phone] Insurance/Medicaid/sponsorship through [facility] and Families [address], [address]                                    [place], [place] [phone] [place] [address], [place] [phone]    Dr. Adele Schilder  ([phone]) [phone]   [facility] Dept. 1) [address] [place] 2) [place] 3)  [address], [place] [phone]  [phone]   [place] (323)767-8421 or ([phone] (After Hours)      Take your usual prescriptions as previously directed. Your hemoglobin level was slightly lower than last years' level today, and your renal function (kidney) tests were mildly elevated (there were no old tests to compare to your numbers today). Call your regular medical doctor and your GI doctor tomorrow to schedule a follow up appointment this week to recheck your hemoglobin level and renal function tests.  Return to the Emergency Department immediately sooner if worsening.

## 2015-11-30 ENCOUNTER — Telehealth: Payer: Self-pay

## 2015-11-30 NOTE — Telephone Encounter (Signed)
I tried to call pt and left message for a return call.

## 2015-12-08 ENCOUNTER — Emergency Department (HOSPITAL_COMMUNITY)
Admission: EM | Admit: 2015-12-08 | Discharge: 2015-12-08 | Disposition: A | Discharge status: Home / Self Care | Payer: Non-veteran care | Attending: Emergency Medicine | Admitting: Emergency Medicine

## 2015-12-08 ENCOUNTER — Encounter (HOSPITAL_COMMUNITY): Payer: Self-pay | Admitting: Emergency Medicine

## 2015-12-08 DIAGNOSIS — L600 Ingrowing nail: Secondary | ICD-10-CM | POA: Insufficient documentation

## 2015-12-08 DIAGNOSIS — Z87891 Personal history of nicotine dependence: Secondary | ICD-10-CM | POA: Diagnosis not present

## 2015-12-08 DIAGNOSIS — I2510 Atherosclerotic heart disease of native coronary artery without angina pectoris: Secondary | ICD-10-CM | POA: Insufficient documentation

## 2015-12-08 DIAGNOSIS — I10 Essential (primary) hypertension: Secondary | ICD-10-CM | POA: Diagnosis not present

## 2015-12-08 LAB — CBG MONITORING, ED: Glucose-Capillary: 230 mg/dL — ABNORMAL HIGH (ref 65–99)

## 2015-12-08 MED ORDER — AMOXICILLIN-POT CLAVULANATE 875-125 MG PO TABS
1.0000 | ORAL_TABLET | Freq: Once | ORAL | Status: AC
  Administered 2015-12-08: 1 via ORAL
  Filled 2015-12-08: qty 1

## 2015-12-08 MED ORDER — LIDOCAINE HCL (PF) 2 % IJ SOLN
10.0000 mL | Freq: Once | INTRAMUSCULAR | Status: AC
  Administered 2015-12-08: 10 mL
  Filled 2015-12-08: qty 10

## 2015-12-08 MED ORDER — ONDANSETRON HCL 4 MG PO TABS
4.0000 mg | ORAL_TABLET | Freq: Once | ORAL | Status: AC
  Administered 2015-12-08: 4 mg via ORAL
  Filled 2015-12-08: qty 1

## 2015-12-08 MED ORDER — HYDROCODONE-ACETAMINOPHEN 5-325 MG PO TABS: 1.0000 | ORAL_TABLET | ORAL | Status: DC | PRN

## 2015-12-08 MED ORDER — AMOXICILLIN-POT CLAVULANATE 875-125 MG PO TABS: 1.0000 | ORAL_TABLET | Freq: Two times a day (BID) | ORAL | Status: DC

## 2015-12-08 NOTE — ED Notes (Signed)
Pt made aware to return if symptoms worsen or if any life threatening symptoms occur.   

## 2015-12-08 NOTE — ED Notes (Signed)
Pt c/o ingrown toenail to left great toe.  C/o says glucose being low and ate honeybun, glucose 230.
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
## 2015-12-08 NOTE — Discharge Instructions (Signed)
Please cleanse the wound with soap and water daily. Please apply clean dressing. It is important that you keep the foot elevated above your waist is much as possible. Use Augmentin 2 times daily with food until all taken. Use Tylenol for mild pain, use Norco for severe pain. This medication may cause drowsiness, please use with caution. [facility] or return to the emergency department if any red streaking, excessive pus drainage, excessive redness, or changes in your general condition. Ingrown Toenail An ingrown toenail occurs when the corner or sides of your toenail grow into the surrounding skin. The big toe is most commonly affected, but it can happen to any of your toes. If your ingrown toenail is not treated, you will be at risk for infection. CAUSES This condition may be caused by:  Wearing shoes that are too small or tight.  Injury or trauma, such as stubbing your toe or having your toe stepped on.  Improper cutting or care of your toenails.  Being born with (congenital) nail or foot abnormalities, such as having a nail that is too big for your toe. RISK FACTORS Risk factors for an ingrown toenail include:  Age. Your nails tend to thicken as you get older, so ingrown nails are more common in older people.  Diabetes.  Cutting your toenails incorrectly.  Blood circulation problems. SYMPTOMS Symptoms may include:  Pain, soreness, or tenderness.  Redness.  Swelling.  Hardening of the skin surrounding the toe. Your ingrown toenail may be infected if there is fluid, pus, or drainage. DIAGNOSIS  An ingrown toenail may be diagnosed by medical history and physical exam. If your toenail is infected, your health care provider may test a sample of the drainage. TREATMENT Treatment depends on the severity of your ingrown toenail. Some ingrown toenails may be treated at home. More severe or infected ingrown toenails may require  surgery to remove all or part of the nail. Infected ingrown toenails may also be treated with antibiotic medicines. HOME CARE INSTRUCTIONS  If you were prescribed an antibiotic medicine, finish all of it even if you start to feel better.  Soak your foot in warm soapy water for 20 minutes, 3 times per day or as directed by your health care provider.  Carefully lift the edge of the nail away from the sore skin by wedging a small piece of cotton under the corner of the nail. This may help with the pain. Be careful not to cause more injury to the area.  Wear shoes that fit well. If your ingrown toenail is causing you pain, try wearing sandals, if possible.  Trim your toenails regularly and carefully. Do not cut them in a curved shape. Cut your toenails straight across. This prevents injury to the skin at the corners of the toenail.  Keep your feet clean and dry.  If you are having trouble walking and are given crutches by your health care provider, use them as directed.  Do not pick at your toenail or try to remove it yourself.  Take medicines only as directed by your health care provider.  Keep all follow-up visits as directed by your health care provider. This is important. SEEK MEDICAL CARE IF:  Your symptoms do not improve with treatment. SEEK IMMEDIATE MEDICAL CARE IF:  You have red streaks that start at your foot and go up your leg.  You have a fever.  You have increased redness, swelling, or pain.  You have fluid,  blood, or pus coming from your toenail.   This information is not intended to replace advice given to you by your health care provider. Make sure you discuss any questions you have with your health care provider.   Document Released: 08/26/2000 Document Revised: 01/13/2015 Document Reviewed: 07/23/2014 Elsevier Interactive Patient Education [employer].

## 2015-12-15 ENCOUNTER — Encounter (HOSPITAL_COMMUNITY): Payer: Self-pay | Admitting: Emergency Medicine

## 2015-12-15 ENCOUNTER — Emergency Department (HOSPITAL_COMMUNITY)
Admission: EM | Admit: 2015-12-15 | Discharge: 2015-12-15 | Disposition: A | Discharge status: Home / Self Care | Payer: Non-veteran care | Attending: Emergency Medicine | Admitting: Emergency Medicine

## 2015-12-15 DIAGNOSIS — I10 Essential (primary) hypertension: Secondary | ICD-10-CM | POA: Diagnosis not present

## 2015-12-15 DIAGNOSIS — Z7982 Long term (current) use of aspirin: Secondary | ICD-10-CM | POA: Insufficient documentation

## 2015-12-15 DIAGNOSIS — E876 Hypokalemia: Secondary | ICD-10-CM | POA: Diagnosis not present

## 2015-12-15 DIAGNOSIS — Z79899 Other long term (current) drug therapy: Secondary | ICD-10-CM | POA: Diagnosis not present

## 2015-12-15 DIAGNOSIS — Z87891 Personal history of nicotine dependence: Secondary | ICD-10-CM | POA: Insufficient documentation

## 2015-12-15 DIAGNOSIS — I2510 Atherosclerotic heart disease of native coronary artery without angina pectoris: Secondary | ICD-10-CM | POA: Diagnosis not present

## 2015-12-15 DIAGNOSIS — L600 Ingrowing nail: Secondary | ICD-10-CM

## 2015-12-15 MED ORDER — HYDROCODONE-ACETAMINOPHEN 5-325 MG PO TABS
2.0000 | ORAL_TABLET | Freq: Once | ORAL | Status: AC
  Administered 2015-12-15: 2 via ORAL
  Filled 2015-12-15: qty 2

## 2015-12-15 MED ORDER — ONDANSETRON HCL 4 MG PO TABS
4.0000 mg | ORAL_TABLET | Freq: Once | ORAL | Status: AC
  Administered 2015-12-15: 4 mg via ORAL
  Filled 2015-12-15: qty 1

## 2015-12-15 MED ORDER — HYDROCODONE-ACETAMINOPHEN 5-325 MG PO TABS: 1.0000 | ORAL_TABLET | ORAL | Status: DC | PRN

## 2015-12-15 MED ORDER — LIDOCAINE HCL (PF) 2 % IJ SOLN
INTRAMUSCULAR | Status: AC
  Filled 2015-12-15: qty 10

## 2015-12-15 MED ORDER — AMOXICILLIN-POT CLAVULANATE 875-125 MG PO TABS
1.0000 | ORAL_TABLET | Freq: Once | ORAL | Status: AC
  Administered 2015-12-15: 1 via ORAL
  Filled 2015-12-15: qty 1

## 2015-12-15 NOTE — Discharge Instructions (Signed)
Please cleanse the big toenail area with soap and water. Apply Band-Aid until the wound is healed completely. Please see your doctor if any puslike drainage, red streaks going up the foot, or signs of advancing infection. Use Tylenol for mild pain, use Norco for more severe pain. Ingrown Toenail An ingrown toenail occurs when the corner or sides of your toenail grow into the surrounding skin. The big toe is most commonly affected, but it can happen to any of your toes. If your ingrown toenail is not treated, you will be at risk for infection. CAUSES This condition may be caused by:  Wearing shoes that are too small or tight.  Injury or trauma, such as stubbing your toe or having your toe stepped on.  Improper cutting or care of your toenails.  Being born with (congenital) nail or foot abnormalities, such as having a nail that is too big for your toe. RISK FACTORS Risk factors for an ingrown toenail include:  Age. Your nails tend to thicken as you get older, so ingrown nails are more common in older people.  Diabetes.  Cutting your toenails incorrectly.  Blood circulation problems. SYMPTOMS Symptoms may include:  Pain, soreness, or tenderness.  Redness.  Swelling.  Hardening of the skin surrounding the toe. Your ingrown toenail may be infected if there is fluid, pus, or drainage. DIAGNOSIS  An ingrown toenail may be diagnosed by medical history and physical exam. If your toenail is infected, your health care provider may test a sample of the drainage. TREATMENT Treatment depends on the severity of your ingrown toenail. Some ingrown toenails may be treated at home. More severe or infected ingrown toenails may require surgery to remove all or part of the nail. Infected ingrown toenails may also be treated with antibiotic medicines. HOME CARE INSTRUCTIONS  If you were prescribed an antibiotic medicine, finish all of it even if you start to feel better.  Soak your foot in warm  soapy water for 20 minutes, 3 times per day or as directed by your health care provider.  Carefully lift the edge of the nail away from the sore skin by wedging a small piece of cotton under the corner of the nail. This may help with the pain. Be careful not to cause more injury to the area.  Wear shoes that fit well. If your ingrown toenail is causing you pain, try wearing sandals, if possible.  Trim your toenails regularly and carefully. Do not cut them in a curved shape. Cut your toenails straight across. This prevents injury to the skin at the corners of the toenail.  Keep your feet clean and dry.  If you are having trouble walking and are given crutches by your health care provider, use them as directed.  Do not pick at your toenail or try to remove it yourself.  Take medicines only as directed by your health care provider.  Keep all follow-up visits as directed by your health care provider. This is important. SEEK MEDICAL CARE IF:  Your symptoms do not improve with treatment. SEEK IMMEDIATE MEDICAL CARE IF:  You have red streaks that start at your foot and go up your leg.  You have a fever.  You have increased redness, swelling, or pain.  You have fluid, blood, or pus coming from your toenail.   This information is not intended to replace advice given to you by your health care provider. Make sure you discuss any questions you have with your health care provider.  Document Released: 08/26/2000 Document Revised: 01/13/2015 Document Reviewed: 07/23/2014 Elsevier Interactive Patient Education [employer].
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
## 2015-12-15 NOTE — ED Notes (Signed)
PT states he had half of his left great toenail removed and now the other half is coming off

## 2015-12-22 ENCOUNTER — Telehealth: Payer: Self-pay | Admitting: Gastroenterology

## 2015-12-22 NOTE — Telephone Encounter (Signed)
ON RECALL FOR TCS IN THE NEXT 3 MONTHS

## 2015-12-22 NOTE — Telephone Encounter (Signed)
Routing to Doris for Triage 

## 2016-01-14 ENCOUNTER — Telehealth: Payer: Self-pay

## 2016-01-14 NOTE — Telephone Encounter (Signed)
Triaged today.  

## 2016-01-15 ENCOUNTER — Emergency Department (HOSPITAL_COMMUNITY)
Admission: EM | Admit: 2016-01-15 | Discharge: 2016-01-15 | Disposition: A | Discharge status: Home / Self Care | Payer: Non-veteran care | Attending: Emergency Medicine | Admitting: Emergency Medicine

## 2016-01-15 ENCOUNTER — Encounter (HOSPITAL_COMMUNITY): Payer: Self-pay | Admitting: Emergency Medicine

## 2016-01-15 DIAGNOSIS — Z87891 Personal history of nicotine dependence: Secondary | ICD-10-CM | POA: Diagnosis not present

## 2016-01-15 DIAGNOSIS — M79674 Pain in right toe(s): Secondary | ICD-10-CM | POA: Diagnosis present

## 2016-01-15 DIAGNOSIS — L600 Ingrowing nail: Secondary | ICD-10-CM | POA: Diagnosis not present

## 2016-01-15 DIAGNOSIS — I2510 Atherosclerotic heart disease of native coronary artery without angina pectoris: Secondary | ICD-10-CM | POA: Insufficient documentation

## 2016-01-15 DIAGNOSIS — I10 Essential (primary) hypertension: Secondary | ICD-10-CM | POA: Diagnosis not present

## 2016-01-15 NOTE — ED Notes (Addendum)
Pt reports right great toe pain for last several days. Pt reports "its in grown and I want it fixed before it gets bad like my other one did." nad noted.
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
## 2016-01-21 NOTE — Telephone Encounter (Signed)
Gastroenterology Pre-Procedure Review  Request Date: 01/14/2016 Requesting Physician: Dr. Oneida Alar  PATIENT REVIEW QUESTIONS: The patient responded to the following health history questions as indicated:    1. Diabetes Melitis: no 2. Joint replacements in the past 12 months: no 3. Major health problems in the past 3 months: no 4. Has an artificial valve or MVP: no 5. Has a defibrillator: no 6. Has been advised in past to take antibiotics in advance of a procedure like teeth cleaning: no 7. Family history of colon cancer: no  8. Alcohol Use: Drinks one 12 oz daily 9. History of sleep apnea: YES     MEDICATIONS & ALLERGIES:    Patient reports the following regarding taking any blood thinners:   Plavix? no Aspirin? no Coumadin? no  Patient confirms/reports the following medications:  Current Outpatient Prescriptions  Medication Sig Dispense Refill  . albuterol (PROVENTIL HFA;VENTOLIN HFA) 108 (90 BASE) MCG/ACT inhaler Inhale 1-2 puffs into the lungs every 6 (six) hours as needed for wheezing or shortness of breath.     Marland Kitchen aspirin EC 81 MG tablet Take 81 mg by mouth daily.    Marland Kitchen atorvastatin (LIPITOR) 80 MG tablet Take 80 mg by mouth daily.    . carvedilol (COREG) 25 MG tablet Take 25 mg by mouth daily.    Marland Kitchen dexlansoprazole (DEXILANT) 60 MG capsule Take 1 capsule (60 mg total) by mouth daily. 30 capsule 11  . enalapril (VASOTEC) 20 MG tablet Take 20 mg by mouth daily.    . ferrous sulfate 325 (65 FE) MG tablet Take 325 mg by mouth 2 (two) times daily.    . folic acid (FOLVITE) 1 MG tablet Take 1 mg by mouth daily.    . furosemide (LASIX) 40 MG tablet Take 40 mg by mouth daily.    Marland Kitchen loratadine (CLARITIN) 10 MG tablet Take 10 mg by mouth daily.    . Multiple Vitamins-Minerals (MULTIVITAMINS THER. W/MINERALS) TABS tablet Take 1 tablet by mouth daily.    . potassium chloride SA (K-DUR,KLOR-CON) 20 MEQ tablet Take 20 mEq by mouth daily.    . temazepam (RESTORIL) 30 MG capsule Take 30 mg by  mouth at bedtime.    Marland Kitchen amoxicillin-clavulanate (AUGMENTIN) 875-125 MG tablet Take 1 tablet by mouth every 12 (twelve) hours. (Patient not taking: Reported on 01/14/2016) 14 tablet 0  . HYDROcodone-acetaminophen (NORCO/VICODIN) 5-325 MG tablet Take 1 tablet by mouth every 4 (four) hours as needed. (Patient not taking: Reported on 01/14/2016) 12 tablet 0  . hydrocortisone (ANUSOL-HC) 2.5 % rectal cream Place 1 application rectally 2 (two) times daily. (Patient not taking: Reported on 01/14/2016) 30 g 1  . hydrocortisone 2.5 % cream Apply 1 application topically as needed. Reported on 01/14/2016     No current facility-administered medications for this visit.    Patient confirms/reports the following allergies:  Allergies  Allergen Reactions  . Triamterene Other (See Comments)    Reaction unknown    No orders of the defined types were placed in this encounter.    AUTHORIZATION INFORMATION Primary Insurance:  ID #:  Group #:  Pre-Cert / Auth required: Pre-Cert / Auth #:   Secondary Insurance:   ID #:  Group #:  Pre-Cert / Auth required:  Pre-Cert / Auth #:  SCHEDULE INFORMATION: Procedure has been scheduled as follows:  Date:                Time:   Location:   This Gastroenterology Pre-Precedure Review Form is being routed to  the following provider(s): Barney Drain, MD

## 2016-01-26 NOTE — Telephone Encounter (Signed)
MOVI PREP SPLIT DOSING, REGULAR BREAKFAST. CLEAR LIQUIDS AFTER 9 AM.  

## 2016-02-03 NOTE — Telephone Encounter (Signed)
Tried to call pt. Vm not set up. Mailing a letter to call for date and time.

## 2016-06-01 ENCOUNTER — Encounter: Payer: Self-pay | Admitting: Gastroenterology

## 2016-06-01 ENCOUNTER — Ambulatory Visit (INDEPENDENT_AMBULATORY_CARE_PROVIDER_SITE_OTHER): Payer: Non-veteran care | Admitting: Gastroenterology

## 2016-06-01 ENCOUNTER — Other Ambulatory Visit: Payer: Self-pay

## 2016-06-01 VITALS — BP 89/54 | HR 68 | Temp 98.2°F | Ht 67.0 in | Wt 204.0 lb

## 2016-06-01 DIAGNOSIS — K625 Hemorrhage of anus and rectum: Secondary | ICD-10-CM

## 2016-06-01 DIAGNOSIS — Z86010 Personal history of colonic polyps: Secondary | ICD-10-CM | POA: Diagnosis not present

## 2016-06-01 MED ORDER — PEG-KCL-NACL-NASULF-NA ASC-C 100 G PO SOLR: 1.0000 | ORAL | 0 refills | Status: DC
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
## 2016-06-01 NOTE — Patient Instructions (Signed)
1. Colonoscopy with possible hemorrhoid banding with Dr. Oneida Alar as scheduled.

## 2016-06-01 NOTE — Progress Notes (Signed)
No pcp

## 2016-06-06 NOTE — Progress Notes (Signed)
NO PCP

## 2016-06-14 ENCOUNTER — Telehealth: Payer: Self-pay

## 2016-06-14 NOTE — Telephone Encounter (Signed)
LMOM for a return call. Per Neil Crouch, PA pt needs labs. Orders have been faxed to [facility].

## 2016-06-16 NOTE — Telephone Encounter (Signed)
LMOM that he needs labs and also mailed a letter with the lab orders enclosed.

## 2016-06-20 ENCOUNTER — Ambulatory Visit (HOSPITAL_COMMUNITY): Admission: RE | Admit: 2016-06-20 | Payer: Non-veteran care | Source: Ambulatory Visit | Admitting: Gastroenterology

## 2016-06-20 ENCOUNTER — Telehealth: Payer: Self-pay | Admitting: General Practice

## 2016-06-20 SURGERY — COLONOSCOPY
Anesthesia: Moderate Sedation

## 2016-06-20 NOTE — Telephone Encounter (Signed)
I received a call from Ginger stating the [facility] says the patient doesn't have an approval letter on file for this patient.  We tried to call the patient and all emergency contacts listed on his registration card since Friday, October 6th, however we were unsuccessful in reaching him.  Ginger spoke with the [facility] on Friday and they too stated there was no approval letter on file for him.  We will instruct Melanie at [facility] endoscopy center to let her know the patient needs to speak with [place] in regards to his approval letter

## 2016-06-20 NOTE — Telephone Encounter (Signed)
Endo calIed and I talked to him and told him that he did not have a letter from the [place] and he would have to pay for the whole thing if he had it done. He stated that he could not pay for that.

## 2016-06-21 NOTE — Telephone Encounter (Signed)
I called the [place] at [phone] to see about obtaining an authorization for Alan Vasquez procedure

## 2016-06-21 NOTE — Telephone Encounter (Signed)
Routing to Ginger.  

## 2016-06-21 NOTE — Telephone Encounter (Signed)
I talked with the [facility] this morning which where his P[facility] is at and left a message for the nursing to call me back about getting a referral letter for him to have TCS.

## 2016-06-22 NOTE — Telephone Encounter (Signed)
Alan Vasquez will call patient to touch base to see what is going on. She said that he never called them to inform them that he was having any problems to need a TCS.

## 2016-07-01 LAB — BASIC METABOLIC PANEL
BUN: 29 mg/dL — ABNORMAL HIGH (ref 7–25)
CO2: 27 mmol/L (ref 20–31)
Calcium: 8.7 mg/dL (ref 8.6–10.3)
Chloride: 105 mmol/L (ref 98–110)
Creat: 1.98 mg/dL — ABNORMAL HIGH (ref 0.70–1.25)
GLUCOSE: 129 mg/dL — AB (ref 65–99)
POTASSIUM: 4.6 mmol/L (ref 3.5–5.3)
SODIUM: 141 mmol/L (ref 135–146)

## 2016-07-01 LAB — CBC WITH DIFFERENTIAL/PLATELET
BASOS ABS: 42 {cells}/uL (ref 0–200)
Basophils Relative: 1 %
EOS PCT: 6 %
Eosinophils Absolute: 252 cells/uL (ref 15–500)
HEMATOCRIT: 36.6 % — AB (ref 38.5–50.0)
HEMOGLOBIN: 12.3 g/dL — AB (ref 13.2–17.1)
LYMPHS ABS: 756 {cells}/uL — AB (ref 850–3900)
Lymphocytes Relative: 18 %
MCH: 30.4 pg (ref 27.0–33.0)
MCHC: 33.6 g/dL (ref 32.0–36.0)
MCV: 90.6 fL (ref 80.0–100.0)
MONO ABS: 546 {cells}/uL (ref 200–950)
MPV: 10.6 fL (ref 7.5–12.5)
Monocytes Relative: 13 %
NEUTROS ABS: 2604 {cells}/uL (ref 1500–7800)
Neutrophils Relative %: 62 %
Platelets: 154 10*3/uL (ref 140–400)
RBC: 4.04 MIL/uL — ABNORMAL LOW (ref 4.20–5.80)
RDW: 14.5 % (ref 11.0–15.0)
WBC: 4.2 10*3/uL (ref 3.8–10.8)

## 2016-07-18 NOTE — Progress Notes (Signed)
Remains with mildly elevated creatinine in setting of lasix. No reported h/o renal insufficiency previously per EPIC. Patient should follow up with PCP. Send copy of labs. Remains with mild anemia. Colonoscopy not performed pending [facility] approval.  What is status of getting colonoscopy done?

## 2016-07-18 NOTE — Telephone Encounter (Signed)
Ginger can you follow-up today with the [facility] to see where we are with Mr. Nakayama approval for his tcs

## 2016-07-19 NOTE — Telephone Encounter (Signed)
Tried to call pt with no answer

## 2016-07-19 NOTE — Telephone Encounter (Signed)
I have tried to call the [place] multiple times today with no help. I am going to mail the patient a letter to call the office regarding this matter.

## 2016-07-21 NOTE — Progress Notes (Signed)
I left Vm for pt to call. Mailing a letter to call also.

## 2017-04-13 ENCOUNTER — Emergency Department (HOSPITAL_COMMUNITY)
Admission: EM | Admit: 2017-04-13 | Discharge: 2017-04-13 | Disposition: A | Discharge status: Home / Self Care | Payer: Non-veteran care | Attending: Emergency Medicine | Admitting: Emergency Medicine

## 2017-04-13 ENCOUNTER — Encounter (HOSPITAL_COMMUNITY): Payer: Self-pay | Admitting: *Deleted

## 2017-04-13 DIAGNOSIS — Y33XXXA Other specified events, undetermined intent, initial encounter: Secondary | ICD-10-CM | POA: Diagnosis not present

## 2017-04-13 DIAGNOSIS — Z7982 Long term (current) use of aspirin: Secondary | ICD-10-CM | POA: Insufficient documentation

## 2017-04-13 DIAGNOSIS — Y999 Unspecified external cause status: Secondary | ICD-10-CM | POA: Diagnosis not present

## 2017-04-13 DIAGNOSIS — I2510 Atherosclerotic heart disease of native coronary artery without angina pectoris: Secondary | ICD-10-CM | POA: Insufficient documentation

## 2017-04-13 DIAGNOSIS — I10 Essential (primary) hypertension: Secondary | ICD-10-CM | POA: Insufficient documentation

## 2017-04-13 DIAGNOSIS — S30813A Abrasion of scrotum and testes, initial encounter: Secondary | ICD-10-CM | POA: Insufficient documentation

## 2017-04-13 DIAGNOSIS — N5082 Scrotal pain: Secondary | ICD-10-CM | POA: Insufficient documentation

## 2017-04-13 DIAGNOSIS — Z79899 Other long term (current) drug therapy: Secondary | ICD-10-CM | POA: Diagnosis not present

## 2017-04-13 DIAGNOSIS — Y929 Unspecified place or not applicable: Secondary | ICD-10-CM | POA: Diagnosis not present

## 2017-04-13 DIAGNOSIS — Y939 Activity, unspecified: Secondary | ICD-10-CM | POA: Diagnosis not present

## 2017-04-13 DIAGNOSIS — Z87891 Personal history of nicotine dependence: Secondary | ICD-10-CM | POA: Insufficient documentation

## 2017-04-13 MED ORDER — DOUBLE ANTIBIOTIC 500-10000 UNIT/GM EX OINT
TOPICAL_OINTMENT | Freq: Once | CUTANEOUS | Status: AC
  Administered 2017-04-13: 1 via TOPICAL
  Filled 2017-04-13: qty 1

## 2017-04-13 MED ORDER — DOUBLE ANTIBIOTIC 500-10000 UNIT/GM EX OINT
TOPICAL_OINTMENT | CUTANEOUS | Status: AC
  Administered 2017-04-13: 3
  Filled 2017-04-13: qty 3

## 2017-04-13 NOTE — ED Notes (Signed)
EDP requested sending few packs of polysporin with pt for use at home

## 2017-04-13 NOTE — Discharge Instructions (Signed)
Try and keep the area on the right side of the scrotum dry. Return for worsening redness pain or signs of infection.
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
## 2017-04-13 NOTE — ED Triage Notes (Signed)
Pain in scrotal area right side

## 2017-05-04 IMAGING — DX DG FOOT COMPLETE 3+V*L*
3 series · 3 of 3 positions shown · non-contrast
Comparison: None.

CLINICAL DATA: Erythema and pain of left foot for 2-3 weeks.
Initial encounter.

EXAM:
LEFT FOOT - COMPLETE 3+ VIEW

[foot ap]
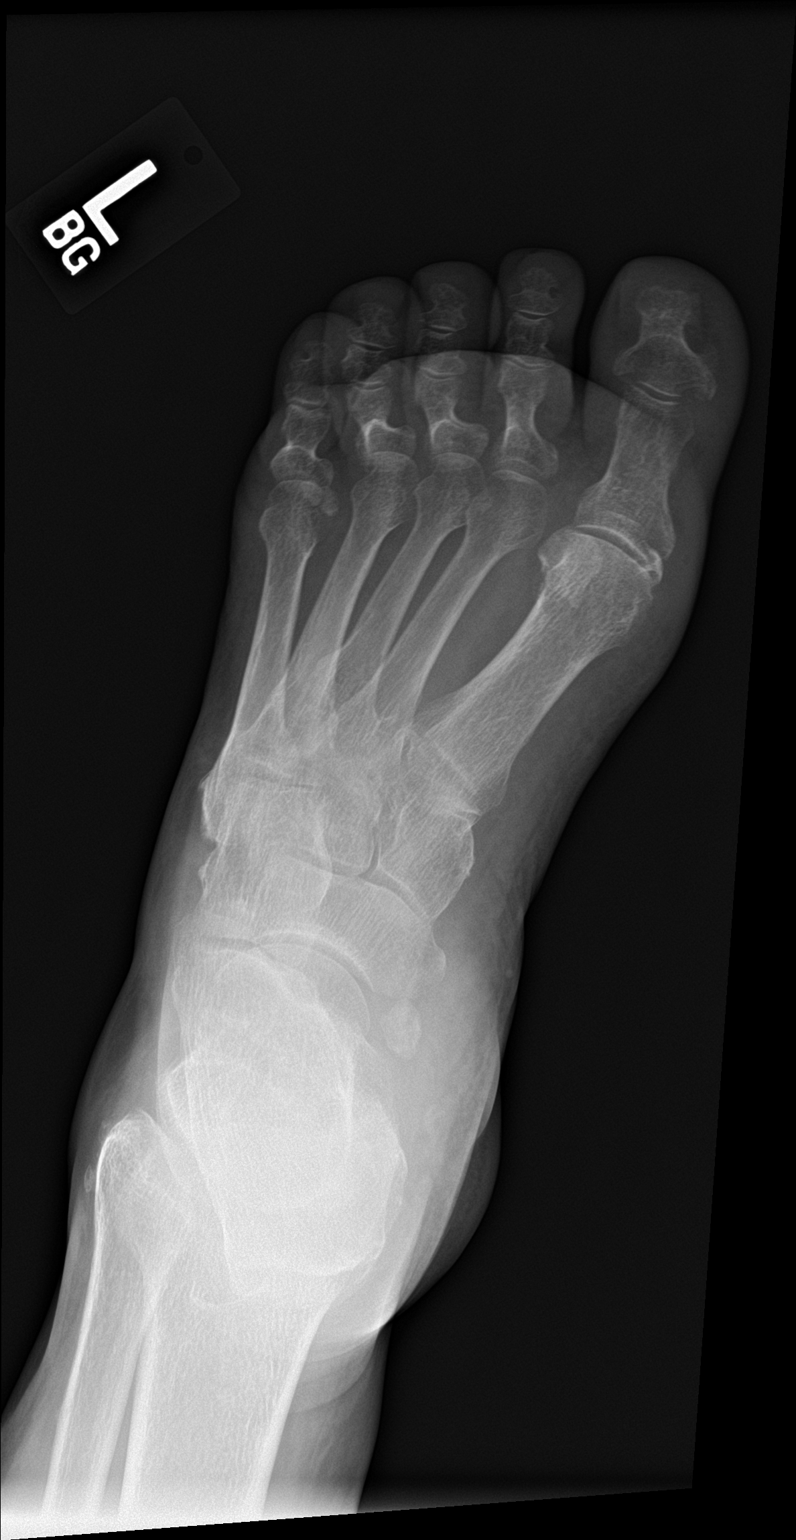

[foot obl]
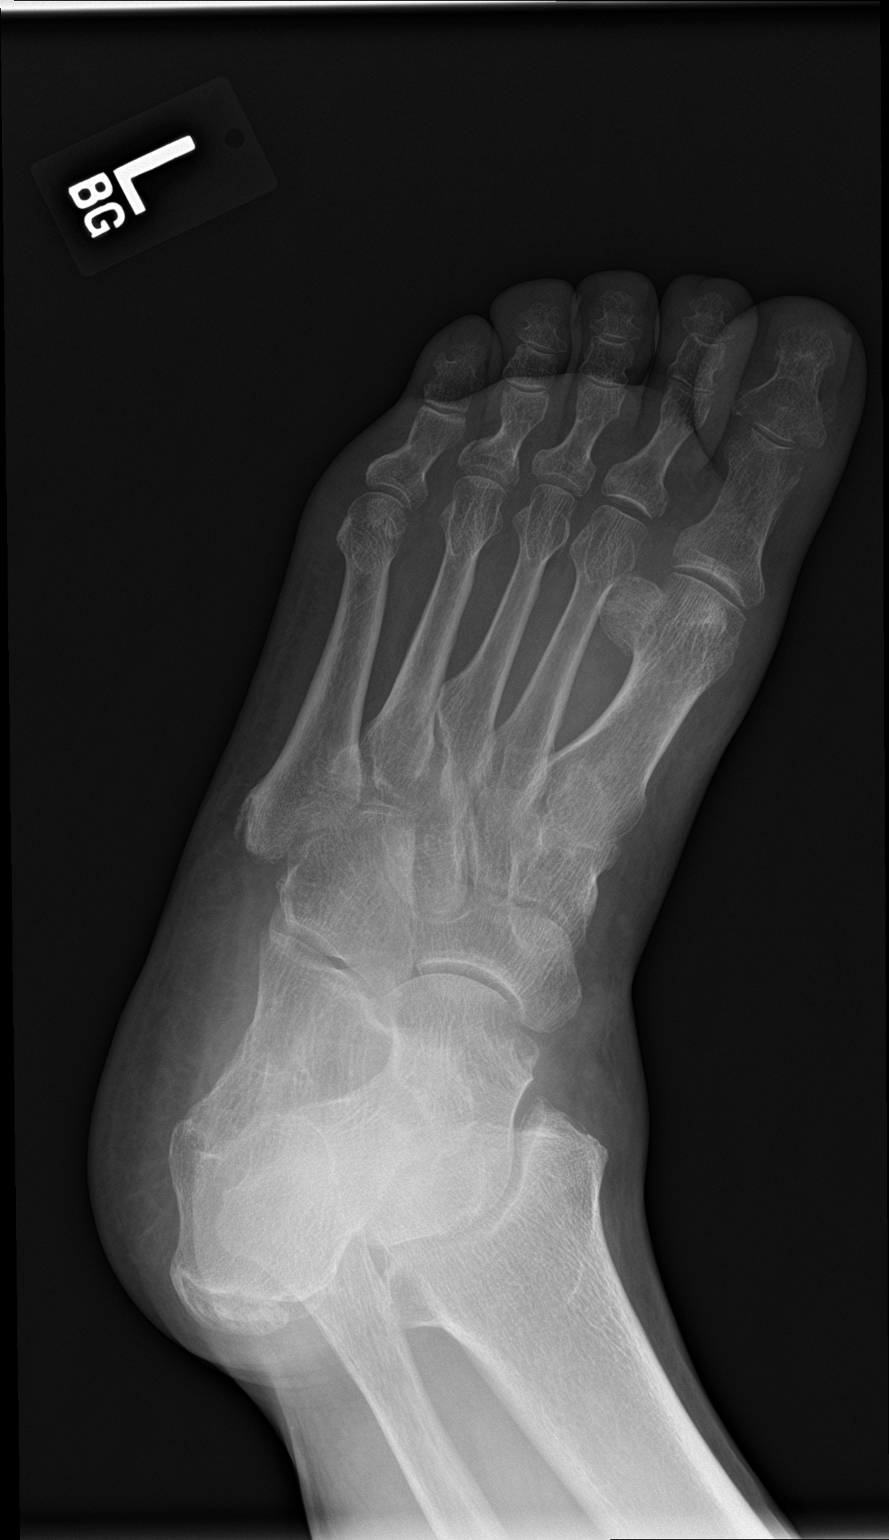

[foot lat]
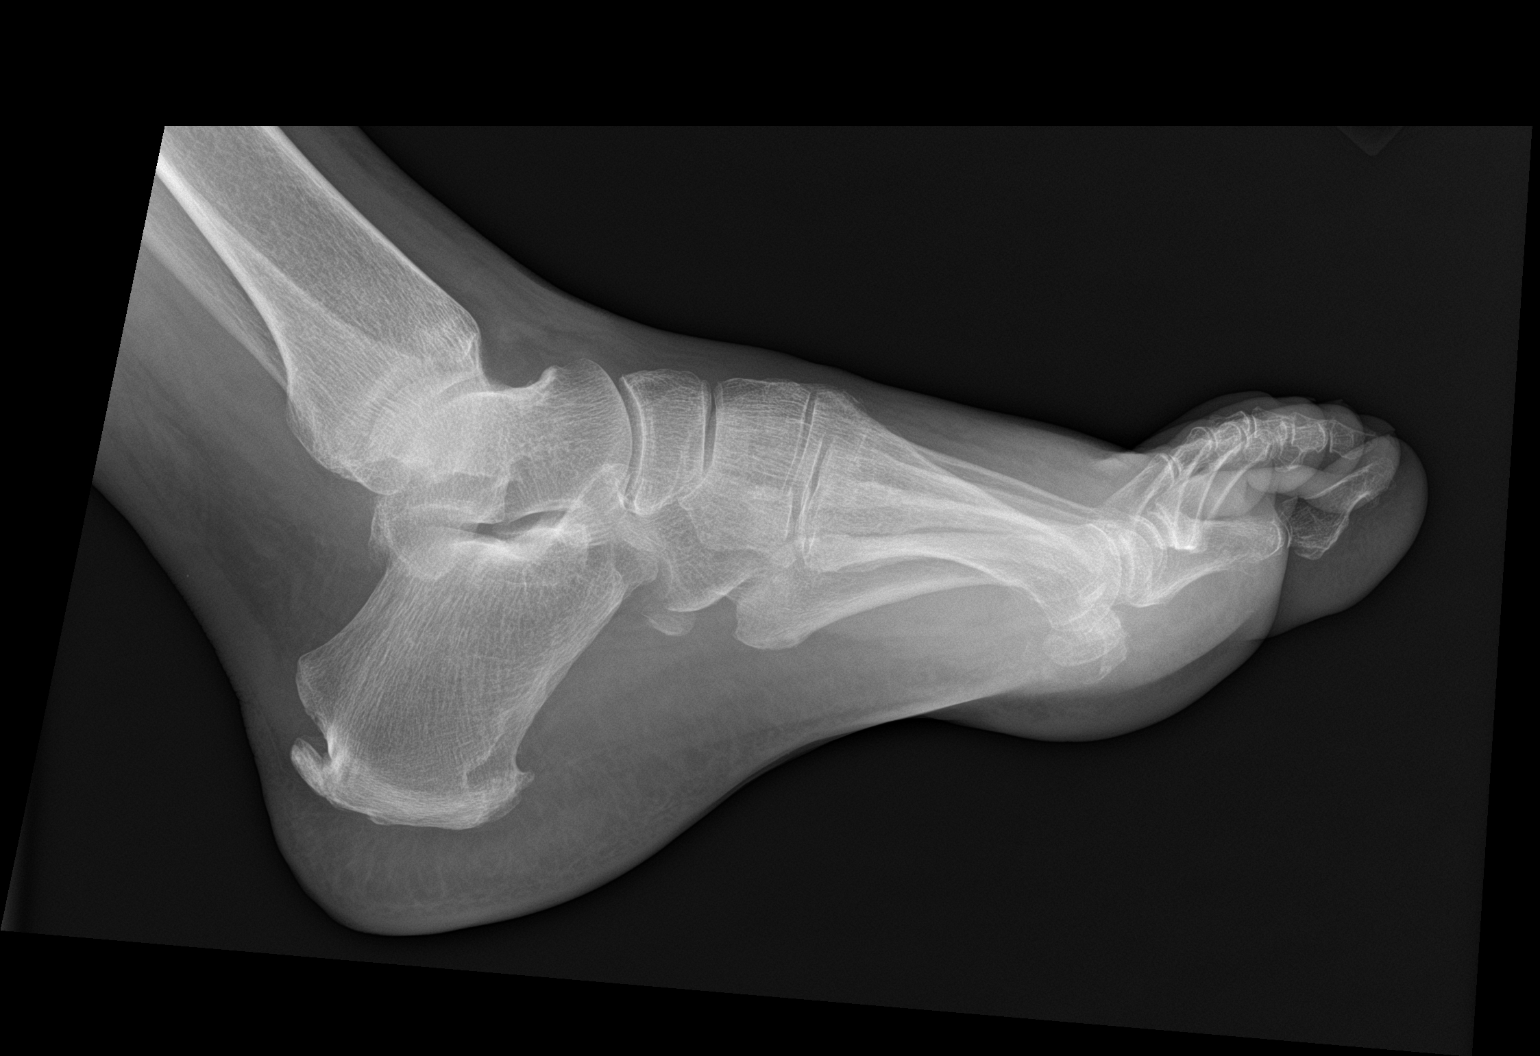

[3 of 3 positions shown; findings below may reference images not displayed]

FINDINGS: No fracture or dislocation identified. There is suggestion of subtle
focal erosions at the base of the first distal phalanx along the
margins of the interphalangeal joint. Some overlying soft tissue
prominence is present. Mild hallux valgus identified. Calcaneal
spurs present. Soft tissues are unremarkable. No focal bony lesions.
IMPRESSION: Suggestion of erosive arthropathy involving the first
interphalangeal joint with some overlying soft tissue prominence.
Findings may be consistent with gout. No acute fracture.

## 2017-09-21 ENCOUNTER — Encounter (HOSPITAL_COMMUNITY): Payer: Self-pay | Admitting: Emergency Medicine

## 2017-09-21 ENCOUNTER — Emergency Department (HOSPITAL_COMMUNITY)
Admission: EM | Admit: 2017-09-21 | Discharge: 2017-09-21 | Disposition: A | Discharge status: Home / Self Care | Payer: Non-veteran care | Attending: Emergency Medicine | Admitting: Emergency Medicine

## 2017-09-21 ENCOUNTER — Other Ambulatory Visit: Payer: Self-pay

## 2017-09-21 DIAGNOSIS — Z7982 Long term (current) use of aspirin: Secondary | ICD-10-CM | POA: Diagnosis not present

## 2017-09-21 DIAGNOSIS — L309 Dermatitis, unspecified: Secondary | ICD-10-CM | POA: Diagnosis not present

## 2017-09-21 DIAGNOSIS — R21 Rash and other nonspecific skin eruption: Secondary | ICD-10-CM | POA: Diagnosis present

## 2017-09-21 DIAGNOSIS — I2510 Atherosclerotic heart disease of native coronary artery without angina pectoris: Secondary | ICD-10-CM | POA: Insufficient documentation

## 2017-09-21 DIAGNOSIS — Z87891 Personal history of nicotine dependence: Secondary | ICD-10-CM | POA: Diagnosis not present

## 2017-09-21 DIAGNOSIS — I10 Essential (primary) hypertension: Secondary | ICD-10-CM | POA: Insufficient documentation

## 2017-09-21 DIAGNOSIS — Z79899 Other long term (current) drug therapy: Secondary | ICD-10-CM | POA: Insufficient documentation

## 2017-09-21 DIAGNOSIS — Z951 Presence of aortocoronary bypass graft: Secondary | ICD-10-CM | POA: Diagnosis not present

## 2017-09-21 HISTORY — DX: Gout, unspecified: M10.9
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
## 2017-09-21 NOTE — ED Triage Notes (Signed)
Patient c/o bilateral great toe redness and swelling that started this morning. Denies any injury or pain. Per patient hx of gout. Denies hx of diabetes.

## 2017-09-21 NOTE — Discharge Instructions (Signed)
Follow-up with your doctor at the [place].  Return here for any worsening symptoms.  You may try 1% hydrocortisone cream 3 times a day

## 2017-09-21 NOTE — ED Notes (Signed)
EDP at bedside  

## 2017-09-28 ENCOUNTER — Other Ambulatory Visit: Payer: Self-pay

## 2017-09-28 ENCOUNTER — Encounter (HOSPITAL_COMMUNITY): Payer: Self-pay | Admitting: Emergency Medicine

## 2017-09-28 ENCOUNTER — Emergency Department (HOSPITAL_COMMUNITY): Payer: Non-veteran care

## 2017-09-28 ENCOUNTER — Inpatient Hospital Stay (HOSPITAL_COMMUNITY)
Admission: EM | Admit: 2017-09-28 | Discharge: 2017-09-30 | DRG: 309 | Discharge status: Against Medical Advice | Payer: Non-veteran care | Attending: Internal Medicine | Admitting: Internal Medicine

## 2017-09-28 DIAGNOSIS — Z7289 Other problems related to lifestyle: Secondary | ICD-10-CM

## 2017-09-28 DIAGNOSIS — N183 Chronic kidney disease, stage 3 unspecified: Secondary | ICD-10-CM

## 2017-09-28 DIAGNOSIS — M109 Gout, unspecified: Secondary | ICD-10-CM | POA: Diagnosis present

## 2017-09-28 DIAGNOSIS — I2510 Atherosclerotic heart disease of native coronary artery without angina pectoris: Secondary | ICD-10-CM | POA: Diagnosis present

## 2017-09-28 DIAGNOSIS — E7800 Pure hypercholesterolemia, unspecified: Secondary | ICD-10-CM | POA: Diagnosis present

## 2017-09-28 DIAGNOSIS — I447 Left bundle-branch block, unspecified: Secondary | ICD-10-CM

## 2017-09-28 DIAGNOSIS — I10 Essential (primary) hypertension: Secondary | ICD-10-CM | POA: Diagnosis not present

## 2017-09-28 DIAGNOSIS — E7849 Other hyperlipidemia: Secondary | ICD-10-CM | POA: Diagnosis not present

## 2017-09-28 DIAGNOSIS — I4891 Unspecified atrial fibrillation: Secondary | ICD-10-CM | POA: Diagnosis not present

## 2017-09-28 DIAGNOSIS — F1010 Alcohol abuse, uncomplicated: Secondary | ICD-10-CM

## 2017-09-28 DIAGNOSIS — G4700 Insomnia, unspecified: Secondary | ICD-10-CM | POA: Diagnosis present

## 2017-09-28 DIAGNOSIS — I1310 Hypertensive heart and chronic kidney disease without heart failure, with stage 1 through stage 4 chronic kidney disease, or unspecified chronic kidney disease: Secondary | ICD-10-CM | POA: Diagnosis present

## 2017-09-28 DIAGNOSIS — K219 Gastro-esophageal reflux disease without esophagitis: Secondary | ICD-10-CM | POA: Diagnosis present

## 2017-09-28 DIAGNOSIS — R748 Abnormal levels of other serum enzymes: Secondary | ICD-10-CM | POA: Diagnosis present

## 2017-09-28 DIAGNOSIS — I248 Other forms of acute ischemic heart disease: Secondary | ICD-10-CM | POA: Diagnosis present

## 2017-09-28 DIAGNOSIS — Z7982 Long term (current) use of aspirin: Secondary | ICD-10-CM

## 2017-09-28 DIAGNOSIS — I25708 Atherosclerosis of coronary artery bypass graft(s), unspecified, with other forms of angina pectoris: Secondary | ICD-10-CM

## 2017-09-28 DIAGNOSIS — I509 Heart failure, unspecified: Secondary | ICD-10-CM

## 2017-09-28 DIAGNOSIS — Z951 Presence of aortocoronary bypass graft: Secondary | ICD-10-CM

## 2017-09-28 DIAGNOSIS — Z888 Allergy status to other drugs, medicaments and biological substances status: Secondary | ICD-10-CM

## 2017-09-28 DIAGNOSIS — I952 Hypotension due to drugs: Secondary | ICD-10-CM | POA: Diagnosis not present

## 2017-09-28 DIAGNOSIS — Z7189 Other specified counseling: Secondary | ICD-10-CM | POA: Diagnosis not present

## 2017-09-28 DIAGNOSIS — Z79899 Other long term (current) drug therapy: Secondary | ICD-10-CM

## 2017-09-28 DIAGNOSIS — T461X5A Adverse effect of calcium-channel blockers, initial encounter: Secondary | ICD-10-CM | POA: Diagnosis not present

## 2017-09-28 DIAGNOSIS — Z87891 Personal history of nicotine dependence: Secondary | ICD-10-CM

## 2017-09-28 LAB — BASIC METABOLIC PANEL
ANION GAP: 10 (ref 5–15)
BUN: 42 mg/dL — AB (ref 6–20)
CO2: 25 mmol/L (ref 22–32)
Calcium: 9.2 mg/dL (ref 8.9–10.3)
Chloride: 102 mmol/L (ref 101–111)
Creatinine, Ser: 1.94 mg/dL — ABNORMAL HIGH (ref 0.61–1.24)
GFR, EST AFRICAN AMERICAN: 39 mL/min — AB (ref >60)
GFR, EST NON AFRICAN AMERICAN: 34 mL/min — AB (ref >60)
Glucose, Bld: 143 mg/dL — ABNORMAL HIGH (ref 65–99)
POTASSIUM: 3.9 mmol/L (ref 3.5–5.1)
SODIUM: 137 mmol/L (ref 135–145)

## 2017-09-28 LAB — TSH: TSH: 2.238 u[IU]/mL (ref 0.350–4.500)

## 2017-09-28 LAB — CBC
HEMATOCRIT: 40.1 % (ref 39.0–52.0)
HEMOGLOBIN: 13.4 g/dL (ref 13.0–17.0)
MCH: 31.2 pg (ref 26.0–34.0)
MCHC: 33.4 g/dL (ref 30.0–36.0)
MCV: 93.3 fL (ref 78.0–100.0)
Platelets: 118 10*3/uL — ABNORMAL LOW (ref 150–400)
RBC: 4.3 MIL/uL (ref 4.22–5.81)
RDW: 14.3 % (ref 11.5–15.5)
WBC: 6 10*3/uL (ref 4.0–10.5)

## 2017-09-28 LAB — PROTIME-INR
INR: 1.03
PROTHROMBIN TIME: 13.4 s (ref 11.4–15.2)

## 2017-09-28 LAB — MAGNESIUM: MAGNESIUM: 1.9 mg/dL (ref 1.7–2.4)

## 2017-09-28 LAB — TROPONIN I
TROPONIN I: 0.29 ng/mL — AB (ref <0.03)
Troponin I: 0.36 ng/mL (ref <0.03)
Troponin I: 0.32 ng/mL (ref <0.03)

## 2017-09-28 LAB — BRAIN NATRIURETIC PEPTIDE: B Natriuretic Peptide: 292 pg/mL — ABNORMAL HIGH (ref 0.0–100.0)

## 2017-09-28 LAB — APTT: aPTT: 33 seconds (ref 24–36)

## 2017-09-28 MED ORDER — ACETAMINOPHEN 325 MG PO TABS: 650.0000 mg | ORAL_TABLET | ORAL | Status: DC | PRN

## 2017-09-28 MED ORDER — ENOXAPARIN SODIUM 100 MG/ML ~~LOC~~ SOLN
90.0000 mg | Freq: Two times a day (BID) | SUBCUTANEOUS | Status: DC
  Administered 2017-09-28: 90 mg via SUBCUTANEOUS
  Filled 2017-09-28: qty 1

## 2017-09-28 MED ORDER — IPRATROPIUM-ALBUTEROL 0.5-2.5 (3) MG/3ML IN SOLN
3.0000 mL | Freq: Once | RESPIRATORY_TRACT | Status: AC
  Administered 2017-09-28: 3 mL via RESPIRATORY_TRACT
  Filled 2017-09-28: qty 3

## 2017-09-28 MED ORDER — ASPIRIN EC 81 MG PO TBEC: 81.0000 mg | DELAYED_RELEASE_TABLET | Freq: Every day | ORAL | Status: DC

## 2017-09-28 MED ORDER — DILTIAZEM HCL-DEXTROSE 100-5 MG/100ML-% IV SOLN (PREMIX)
5.0000 mg/h | INTRAVENOUS | Status: DC
  Administered 2017-09-28: 5 mg/h via INTRAVENOUS
  Filled 2017-09-28: qty 100

## 2017-09-28 MED ORDER — PANTOPRAZOLE SODIUM 40 MG PO TBEC
40.0000 mg | DELAYED_RELEASE_TABLET | Freq: Every day | ORAL | Status: DC
  Administered 2017-09-29: 40 mg via ORAL
  Filled 2017-09-28: qty 1

## 2017-09-28 MED ORDER — FOLIC ACID 1 MG PO TABS
1.0000 mg | ORAL_TABLET | Freq: Every day | ORAL | Status: DC
  Administered 2017-09-29: 1 mg via ORAL
  Filled 2017-09-28: qty 1

## 2017-09-28 MED ORDER — DILTIAZEM LOAD VIA INFUSION
10.0000 mg | Freq: Once | INTRAVENOUS | Status: AC
  Administered 2017-09-28: 10 mg via INTRAVENOUS
  Filled 2017-09-28: qty 10

## 2017-09-28 MED ORDER — FERROUS SULFATE 325 (65 FE) MG PO TABS
325.0000 mg | ORAL_TABLET | Freq: Two times a day (BID) | ORAL | Status: DC
  Administered 2017-09-29 (×3): 325 mg via ORAL
  Filled 2017-09-28 (×3): qty 1

## 2017-09-28 MED ORDER — LORATADINE 10 MG PO TABS
10.0000 mg | ORAL_TABLET | Freq: Every day | ORAL | Status: DC
  Administered 2017-09-29: 10 mg via ORAL
  Filled 2017-09-28: qty 1

## 2017-09-28 MED ORDER — ALLOPURINOL 100 MG PO TABS
100.0000 mg | ORAL_TABLET | Freq: Every day | ORAL | Status: DC
  Administered 2017-09-29: 100 mg via ORAL
  Filled 2017-09-28: qty 1

## 2017-09-28 MED ORDER — ONDANSETRON HCL 4 MG/2ML IJ SOLN: 4.0000 mg | Freq: Four times a day (QID) | INTRAMUSCULAR | Status: DC | PRN

## 2017-09-28 MED ORDER — SODIUM CHLORIDE 0.9 % IV SOLN
INTRAVENOUS | Status: DC
  Administered 2017-09-28: 13:00:00 via INTRAVENOUS

## 2017-09-28 MED ORDER — ENALAPRIL MALEATE 5 MG PO TABS
5.0000 mg | ORAL_TABLET | Freq: Every day | ORAL | Status: DC
Start: 2017-09-28 — End: 2017-09-29

## 2017-09-28 MED ORDER — APIXABAN 5 MG PO TABS
5.0000 mg | ORAL_TABLET | Freq: Two times a day (BID) | ORAL | Status: DC
  Administered 2017-09-29 (×2): 5 mg via ORAL
  Filled 2017-09-28 (×2): qty 1

## 2017-09-28 MED ORDER — IPRATROPIUM BROMIDE 0.02 % IN SOLN
0.5000 mg | Freq: Four times a day (QID) | RESPIRATORY_TRACT | Status: DC | PRN
  Administered 2017-09-29: 0.5 mg via RESPIRATORY_TRACT
  Filled 2017-09-28: qty 2.5

## 2017-09-28 MED ORDER — POTASSIUM CHLORIDE CRYS ER 20 MEQ PO TBCR
20.0000 meq | EXTENDED_RELEASE_TABLET | Freq: Every day | ORAL | Status: DC
  Administered 2017-09-29: 20 meq via ORAL
  Filled 2017-09-28: qty 1

## 2017-09-28 MED ORDER — ATORVASTATIN CALCIUM 40 MG PO TABS
80.0000 mg | ORAL_TABLET | Freq: Every day | ORAL | Status: DC
  Administered 2017-09-29: 80 mg via ORAL
  Filled 2017-09-28 (×2): qty 2

## 2017-09-28 MED ORDER — FUROSEMIDE 20 MG PO TABS
20.0000 mg | ORAL_TABLET | Freq: Two times a day (BID) | ORAL | Status: DC
  Administered 2017-09-28 – 2017-09-30 (×4): 20 mg via ORAL
  Filled 2017-09-28 (×4): qty 1

## 2017-09-28 MED ORDER — SODIUM CHLORIDE 0.9 % IV BOLUS (SEPSIS)
250.0000 mL | Freq: Once | INTRAVENOUS | Status: AC
  Administered 2017-09-28: 250 mL via INTRAVENOUS

## 2017-09-28 MED ORDER — ADULT MULTIVITAMIN W/MINERALS CH
1.0000 | ORAL_TABLET | Freq: Every day | ORAL | Status: DC
  Administered 2017-09-29: 1 via ORAL
  Filled 2017-09-28: qty 1

## 2017-09-28 MED ORDER — CARVEDILOL 12.5 MG PO TABS
25.0000 mg | ORAL_TABLET | Freq: Two times a day (BID) | ORAL | Status: DC
  Administered 2017-09-28 – 2017-09-29 (×2): 25 mg via ORAL
  Filled 2017-09-28 (×2): qty 2

## 2017-09-28 NOTE — ED Notes (Addendum)
CRITICAL VALUE ALERT  Critical Value:  Troponin 0.36  Date & Time Notied: 09/28/17 1620  Provider Notified: Maude Leriche NP   Orders Received/Actions taken:
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
## 2017-09-28 NOTE — ED Notes (Signed)
Date and time results received: 09/28/17 0158 (use smartphrase ".now" to insert current time)  Test: trop Critical Value:0.29 Name of Provider Notified: [email or web address]  Orders Received? Or Actions Taken?:  None
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
## 2017-09-28 NOTE — ED Notes (Signed)
Pt states he has taken all medication that are due at 1515. Pharmacy called and will changes times, per pharmacy do not click off medications.

## 2017-09-28 NOTE — Progress Notes (Signed)
Pharmacy contacted to find out when appropriate start time for eliquis should be given that pt received 90 mg lovenox at 1513 today. RN was told that the on call pharmacist would address this tonight and begin the eliquis at the appropriate time.

## 2017-09-28 NOTE — ED Notes (Signed)
B Strader PA from [place] in to see pt. Requesting cardizem increase 5 mg

## 2017-09-28 NOTE — ED Notes (Signed)
Report given to ICU at this time.  
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
## 2017-09-28 NOTE — ED Triage Notes (Signed)
Patient states he was checking his heart rate at home and it was in the 130s. Patient's HR in triage is 137. Pt states history of Afib and COPD. Denies chest pain, shortness of breath.

## 2017-09-28 NOTE — Progress Notes (Addendum)
ANTICOAGULATION CONSULT NOTE - Initial Consult  Pharmacy Consult for Lovenox >> Apixaban Indication: atrial fibrillation  Allergies  Allergen Reactions  . Triamterene Other (See Comments)    Reaction unknown   Patient Measurements: Height: 5\' 7"  (170.2 cm) Weight: 195 lb (88.5 kg) IBW/kg (Calculated) : 66.1 HEPARIN DW (KG): 84.4   Vital Signs: Temp: 97.9 F (36.6 C) (01/17 1140) Temp Source: Oral (01/17 1140) BP: 100/73 (01/17 1433) Pulse Rate: 97 (01/17 1433)  Labs: Recent Labs    09/28/17 1209  HGB 13.4  HCT 40.1  PLT 118*  CREATININE 1.94*  TROPONINI 0.29*   Estimated Creatinine Clearance: 38.2 mL/min (A) (by C-G formula based on SCr of 1.94 mg/dL (H)).  Medical History: Past Medical History:  Diagnosis Date  . Coronary artery disease   . Diverticulosis   . GERD (gastroesophageal reflux disease)   . Gout   . Hemorrhoid   . High cholesterol   . Hypertension   . Hypokalemia   . Rectal bleeding    Medications:   (Not in a hospital admission) Reviewed, not on anticoagulant at home.  Assessment: Okay for Protocol, no bleeding noted.  Baseline anticoagulation labs pending.  Thrombocytopenia noted.  Goal of Therapy:  Anti-Xa level 0.6-1 units/ml 4hrs after LMWH dose given if clinically indicated. Monitor platelets by anticoagulation protocol: Yes   Plan:  Lovenox 90mg  SQ every 12 hours. CBC every 48 hours. Monitor for signs and symptoms of bleeding.   Alan Vasquez 09/28/2017,2:49 PM  PM: Transition for Apixaban for Afib Received a treatment dose of Lovenox this afternoon. Apixaban will start tomorrow at a dose of 5mg  PO bid. Monitor for signs and symptoms of bleeding.  Educate. Alan Vasquez, [facility] 09/28/2017 6:18 PM

## 2017-09-29 ENCOUNTER — Inpatient Hospital Stay (HOSPITAL_COMMUNITY): Payer: Non-veteran care

## 2017-09-29 DIAGNOSIS — I340 Nonrheumatic mitral (valve) insufficiency: Secondary | ICD-10-CM | POA: Diagnosis not present

## 2017-09-29 DIAGNOSIS — I4891 Unspecified atrial fibrillation: Secondary | ICD-10-CM | POA: Diagnosis not present

## 2017-09-29 DIAGNOSIS — I10 Essential (primary) hypertension: Secondary | ICD-10-CM | POA: Diagnosis not present

## 2017-09-29 DIAGNOSIS — I25708 Atherosclerosis of coronary artery bypass graft(s), unspecified, with other forms of angina pectoris: Secondary | ICD-10-CM | POA: Diagnosis not present

## 2017-09-29 DIAGNOSIS — Z789 Other specified health status: Secondary | ICD-10-CM

## 2017-09-29 DIAGNOSIS — N183 Chronic kidney disease, stage 3 (moderate): Secondary | ICD-10-CM | POA: Diagnosis not present

## 2017-09-29 LAB — ECHOCARDIOGRAM COMPLETE
AVLVOTPG: 3 mmHg
CHL CUP MV DEC (S): 127
CHL CUP RV SYS PRESS: 27 mmHg
E decel time: 127 msec
FS: 11 % — AB (ref 28–44)
Height: 67 in
IV/PV OW: 1.12
LA ID, A-P, ES: 52 mm
LA diam index: 2.49 cm/m2
LA vol A4C: 114 ml
LA vol index: 50.3 mL/m2
LA vol: 105 mL
LDCA: 2.84 cm2
LEFT ATRIUM END SYS DIAM: 52 mm
LVOT VTI: 16.4 cm
LVOT diameter: 19 mm
LVOTPV: 92.4 cm/s
LVOTSV: 47 mL
MV Peak grad: 7 mmHg
MVPKEVEL: 134 m/s
PW: 12.9 mm — AB (ref 0.6–1.1)
Reg peak vel: 247 cm/s
TAPSE: 10.7 mm
TR max vel: 247 cm/s
Weight: 3160.51 oz

## 2017-09-29 LAB — BASIC METABOLIC PANEL
ANION GAP: 10 (ref 5–15)
BUN: 43 mg/dL — AB (ref 6–20)
CHLORIDE: 101 mmol/L (ref 101–111)
CO2: 24 mmol/L (ref 22–32)
Calcium: 8.4 mg/dL — ABNORMAL LOW (ref 8.9–10.3)
Creatinine, Ser: 1.94 mg/dL — ABNORMAL HIGH (ref 0.61–1.24)
GFR calc Af Amer: 39 mL/min — ABNORMAL LOW (ref >60)
GFR, EST NON AFRICAN AMERICAN: 34 mL/min — AB (ref >60)
GLUCOSE: 98 mg/dL (ref 65–99)
POTASSIUM: 3.6 mmol/L (ref 3.5–5.1)
Sodium: 135 mmol/L (ref 135–145)

## 2017-09-29 LAB — MRSA PCR SCREENING: MRSA by PCR: NEGATIVE

## 2017-09-29 LAB — CBC
HEMATOCRIT: 36.9 % — AB (ref 39.0–52.0)
HEMOGLOBIN: 12.4 g/dL — AB (ref 13.0–17.0)
MCH: 31.2 pg (ref 26.0–34.0)
MCHC: 33.6 g/dL (ref 30.0–36.0)
MCV: 92.9 fL (ref 78.0–100.0)
Platelets: 105 10*3/uL — ABNORMAL LOW (ref 150–400)
RBC: 3.97 MIL/uL — AB (ref 4.22–5.81)
RDW: 14 % (ref 11.5–15.5)
WBC: 5.7 10*3/uL (ref 4.0–10.5)

## 2017-09-29 LAB — MAGNESIUM: Magnesium: 1.7 mg/dL (ref 1.7–2.4)

## 2017-09-29 LAB — TROPONIN I: Troponin I: 0.32 ng/mL (ref <0.03)

## 2017-09-29 MED ORDER — LORAZEPAM 2 MG/ML IJ SOLN: 1.0000 mg | INTRAMUSCULAR | Status: DC | PRN

## 2017-09-29 MED ORDER — VITAMIN B-1 100 MG PO TABS
100.0000 mg | ORAL_TABLET | Freq: Every day | ORAL | Status: DC
  Administered 2017-09-29: 100 mg via ORAL
  Filled 2017-09-29: qty 1

## 2017-09-29 MED ORDER — MAGNESIUM SULFATE 2 GM/50ML IV SOLN
2.0000 g | Freq: Once | INTRAVENOUS | Status: AC
  Administered 2017-09-29: 2 g via INTRAVENOUS
  Filled 2017-09-29: qty 50

## 2017-09-29 MED ORDER — PERFLUTREN LIPID MICROSPHERE
1.0000 mL | INTRAVENOUS | Status: AC | PRN
  Administered 2017-09-29: 1 mL via INTRAVENOUS
  Administered 2017-09-29: 2 mL via INTRAVENOUS
  Filled 2017-09-29: qty 10

## 2017-09-29 MED ORDER — FOLIC ACID 1 MG PO TABS
1.0000 mg | ORAL_TABLET | Freq: Every day | ORAL | Status: DC
  Administered 2017-09-29: 1 mg via ORAL
  Filled 2017-09-29: qty 1

## 2017-09-29 MED ORDER — CARVEDILOL 12.5 MG PO TABS
37.5000 mg | ORAL_TABLET | Freq: Two times a day (BID) | ORAL | Status: DC
  Administered 2017-09-29 – 2017-09-30 (×2): 37.5 mg via ORAL
  Filled 2017-09-29 (×2): qty 3

## 2017-09-29 MED ORDER — DILTIAZEM HCL 30 MG PO TABS
30.0000 mg | ORAL_TABLET | Freq: Three times a day (TID) | ORAL | Status: DC
  Administered 2017-09-29: 30 mg via ORAL
  Filled 2017-09-29: qty 1

## 2017-09-29 NOTE — Progress Notes (Signed)
*  PRELIMINARY RESULTS* Echocardiogram 2D Echocardiogram has been performed with Definity.  Alan Vasquez 09/29/2017, 2:36 PM

## 2017-09-29 NOTE — Care Management (Signed)
CM has notified [facility] Margarita Grizzle) of pt's admission.

## 2017-09-29 NOTE — Progress Notes (Addendum)
PROGRESS NOTE    Alan Vasquez  CZY:[id number] DOB: 1946-02-13 DOA: 09/28/2017 PCP: [place]   Brief Narrative:   This is a 72 year old male with history of CAD and alcohol use who presented to the emergency department with palpitations that began 3 days prior.  He was admitted with new onset atrial fibrillation with RVR and has been placed on a Cardizem drip in the stepdown unit.  He is currently being transitioned to oral Cardizem per cardiology recommendations with 2D echocardiogram currently pending.  He is otherwise asymptomatic with no acute overnight events noted since admission.  Assessment & Plan:   Principal Problem:   Atrial fibrillation (HCC) Active Problems:   CAD, multiple vessel   Essential hypertension   CKD (chronic kidney disease), stage III ([place])   1. Symptomatic atrial fibrillation with RVR-new onset.    Appreciate cardiology recommendations with conversion to oral Cardizem at this time.  Patient has been placed on oral Cardizem every 8 hours and if stable, will convert to Cardizem extended release 120 mg daily.  Continue Coreg and reduce dose as needed for any issues with hypotension.  ACE inhibitor has been held.  Anticoagulation with Eliquis, and therefore aspirin has been discontinued. 2D echo pending. 2. Troponin elevation-likely secondary to above.    Flat trend..  Hemodynamically stable with no chest pain. 3. CAD with prior CABG.  Continue Coreg at increased dose as noted above.  Continue statin.  Continue aspirin. 4. Essential hypertension-with soft BP.  Continue to monitor with current medications. 5. CKD stage III.  Monitor repeat BMPs.  He appears to be at baseline. 6. Gout.  Continue on Zyloprim 7. GERD.  Continue Protonix 8. ETOH use. Ativan prn with thiamine and folic acid.   DVT prophylaxis: Eliquis Code Status: Full Family Communication: None Disposition Plan:Home once stable hopefully in AM Consults  called:Cardiology Admission status: Inpatient; SDU   Consultants:   Cardiology  Procedures:   None  Antimicrobials:   None   Subjective: Patient seen and evaluated today with no new acute complaints or concerns.  IV Cardizem was decreased last night due to some hypotension.  Objective: Vitals:   09/29/17 1000 09/29/17 1100 09/29/17 1200 09/29/17 1220  BP: 100/62 (!) 84/67 95/64   Pulse: 96 95 70   Resp: (!) 22 18 (!) 24   Temp:    97.9 F (36.6 C)  TempSrc:    Oral  SpO2: 97% 97% 97%   Weight:      Height:        Intake/Output Summary (Last 24 hours) at 09/29/2017 1346 Last data filed at 09/29/2017 0800 Gross per 24 hour  Intake 884.08 ml  Output 1425 ml  Net -540.92 ml   Filed Weights   09/28/17 1140 09/29/17 0351  Weight: 88.5 kg (195 lb) 89.6 kg (197 lb 8.5 oz)    Examination:  General exam: Appears calm and comfortable  Respiratory system: Clear to auscultation. Respiratory effort normal. Cardiovascular system: S1 & S2 heard, RRR. No JVD, murmurs, rubs, gallops or clicks. No pedal edema. Gastrointestinal system: Abdomen is nondistended, soft and nontender. No organomegaly or masses felt. Normal bowel sounds heard. Central nervous system: Alert and oriented. No focal neurological deficits. Extremities: Symmetric 5 x 5 power. Skin: No rashes, lesions or ulcers Psychiatry: Judgement and insight appear normal. Mood & affect appropriate.     Data Reviewed: I have personally reviewed following labs and imaging studies  CBC: Recent Labs  Lab 09/28/17 1209 09/29/17 0325  WBC 6.0 5.7  HGB 13.4 12.4*  HCT 40.1 36.9*  MCV 93.3 92.9  PLT 118* 161*   Basic Metabolic Panel: Recent Labs  Lab 09/28/17 1209 09/28/17 1523 09/29/17 0325  NA 137  --  135  K 3.9  --  3.6  CL 102  --  101  CO2 25  --  24  GLUCOSE 143*  --  98  BUN 42*  --  43*  CREATININE 1.94*  --  1.94*  CALCIUM 9.2  --  8.4*  MG  --  1.9 1.7   GFR: Estimated Creatinine  Clearance: 38.4 mL/min (A) (by C-G formula based on SCr of 1.94 mg/dL (H)). Liver Function Tests: No results for input(s): AST, ALT, ALKPHOS, BILITOT, PROT, ALBUMIN in the last 168 hours. No results for input(s): LIPASE, AMYLASE in the last 168 hours. No results for input(s): AMMONIA in the last 168 hours. Coagulation Profile: Recent Labs  Lab 09/28/17 1209  INR 1.03   Cardiac Enzymes: Recent Labs  Lab 09/28/17 1209 09/28/17 1523 09/28/17 2117 09/29/17 0325  TROPONINI 0.29* 0.36* 0.32* 0.32*   BNP (last 3 results) No results for input(s): PROBNP in the last 8760 hours. HbA1C: No results for input(s): HGBA1C in the last 72 hours. CBG: No results for input(s): GLUCAP in the last 168 hours. Lipid Profile: No results for input(s): CHOL, HDL, LDLCALC, TRIG, CHOLHDL, LDLDIRECT in the last 72 hours. Thyroid Function Tests: Recent Labs    09/28/17 1523  TSH 2.238   Anemia Panel: No results for input(s): VITAMINB12, FOLATE, FERRITIN, TIBC, IRON, RETICCTPCT in the last 72 hours. Sepsis Labs: No results for input(s): PROCALCITON, LATICACIDVEN in the last 168 hours.  Recent Results (from the past 240 hour(s))  MRSA PCR Screening     Status: None   Collection Time: 09/28/17  5:16 PM  Result Value Ref Range Status   MRSA by PCR NEGATIVE NEGATIVE Final    Comment:        The GeneXpert MRSA Assay (FDA approved for NASAL specimens only), is one component of a comprehensive MRSA colonization surveillance program. It is not intended to diagnose MRSA infection nor to guide or monitor treatment for MRSA infections.          Radiology Studies: Dg Chest 2 View  Result Date: 09/28/2017 CLINICAL DATA:  Increased heart rate for 3 days EXAM: CHEST  2 VIEW COMPARISON:  11/04/2015 FINDINGS: Mild bilateral interstitial prominence. No pleural effusion or pneumothorax. No focal consolidation. Stable cardiomegaly. Prior CABG. No acute osseous abnormality. IMPRESSION: Cardiomegaly with  mild pulmonary vascular congestion. Electronically Signed   By: Kathreen Devoid   On: 09/28/2017 12:14        Scheduled Meds: . allopurinol  100 mg Oral Daily  . apixaban  5 mg Oral BID  . atorvastatin  80 mg Oral Daily  . carvedilol  25 mg Oral BID WC  . diltiazem  30 mg Oral Q8H  . ferrous sulfate  325 mg Oral BID  . folic acid  1 mg Oral Daily  . furosemide  20 mg Oral BID  . loratadine  10 mg Oral Daily  . multivitamin with minerals  1 tablet Oral Daily  . pantoprazole  40 mg Oral Daily  . potassium chloride SA  20 mEq Oral Daily   Continuous Infusions:   LOS: 1 day    Time spent: 30 minutes    Mitch Arquette Darleen Crocker, DO [facility] Pager [phone]  If 7PM-7AM, please contact night-coverage [email or web address]  Password [email or web address] 09/29/2017, 1:46 PM

## 2017-09-29 NOTE — Discharge Instructions (Signed)

## 2017-09-29 NOTE — Progress Notes (Addendum)
Progress Note  Patient Name: Alan Vasquez Date of Encounter: 09/29/2017  Primary Cardiologist: [facility]  Subjective   Reports palpitations have resolved, as HR has been in the 70's to low-100's overnight. Breathing at baseline. Main complaint is insomnia, as he reports not sleeping throughout the night. Says he has slept less than 2 hours over the past week.   Inpatient Medications    Scheduled Meds: . allopurinol  100 mg Oral Daily  . apixaban  5 mg Oral BID  . atorvastatin  80 mg Oral Daily  . carvedilol  25 mg Oral BID WC  . enalapril  5 mg Oral Daily  . ferrous sulfate  325 mg Oral BID  . folic acid  1 mg Oral Daily  . furosemide  20 mg Oral BID  . loratadine  10 mg Oral Daily  . multivitamin with minerals  1 tablet Oral Daily  . pantoprazole  40 mg Oral Daily  . potassium chloride SA  20 mEq Oral Daily   Continuous Infusions: . diltiazem (CARDIZEM) infusion 2.5 mg/hr (09/29/17 0000)   PRN Meds: acetaminophen, ipratropium, ondansetron (ZOFRAN) IV   Vital Signs    Vitals:   09/29/17 0615 09/29/17 0630 09/29/17 0645 09/29/17 0700  BP: 122/84 103/82 100/67 112/81  Pulse: (!) 105 (!) 52 99 70  Resp: (!) 28 (!) 22 (!) 22 (!) 21  Temp:      TempSrc:      SpO2: 96% 95% 94% 95%  Weight:      Height:        Intake/Output Summary (Last 24 hours) at 09/29/2017 0732 Last data filed at 09/29/2017 0355 Gross per 24 hour  Intake 564.08 ml  Output 1425 ml  Net -860.92 ml   Filed Weights   09/28/17 1140 09/29/17 0351  Weight: 195 lb (88.5 kg) 197 lb 8.5 oz (89.6 kg)    Telemetry    Atrial fibrillation, HR in 70's to-110's with occasional PVC's.  - Personally Reviewed  ECG    Atrial fibrillation with known LBBB, HR 118- Personally Reviewed  Physical Exam   General: Well developed, well nourished Caucasian male appearing in no acute distress. Head: Normocephalic, atraumatic.  Neck: Supple without bruits, JVD not elevated. Lungs:  Resp regular and  unlabored, CTA without wheezing or rales. Heart: Irregularly irregular, S1, S2, no S3, S4, or murmur; no rub. Abdomen: Soft, non-tender, non-distended with normoactive bowel sounds. No hepatomegaly. No rebound/guarding. No obvious abdominal masses. Extremities: No clubbing, cyanosis, or lower extremity edema. Distal pedal pulses are 2+ bilaterally. Neuro: Alert and oriented X 3. Moves all extremities spontaneously. Psych: Normal affect.  Labs    Chemistry Recent Labs  Lab 09/28/17 1209 09/29/17 0325  NA 137 135  K 3.9 3.6  CL 102 101  CO2 25 24  GLUCOSE 143* 98  BUN 42* 43*  CREATININE 1.94* 1.94*  CALCIUM 9.2 8.4*  GFRNONAA 34* 34*  GFRAA 39* 39*  ANIONGAP 10 10     Hematology Recent Labs  Lab 09/28/17 1209 09/29/17 0325  WBC 6.0 5.7  RBC 4.30 3.97*  HGB 13.4 12.4*  HCT 40.1 36.9*  MCV 93.3 92.9  MCH 31.2 31.2  MCHC 33.4 33.6  RDW 14.3 14.0  PLT 118* 105*    Cardiac Enzymes Recent Labs  Lab 09/28/17 1209 09/28/17 1523 09/28/17 2117 09/29/17 0325  TROPONINI 0.29* 0.36* 0.32* 0.32*   No results for input(s): TROPIPOC in the last 168 hours.   BNP Recent Labs  Lab 09/28/17  1209  BNP 292.0*     DDimer No results for input(s): DDIMER in the last 168 hours.   Radiology    Dg Chest 2 View  Result Date: 09/28/2017 CLINICAL DATA:  Increased heart rate for 3 days EXAM: CHEST  2 VIEW COMPARISON:  11/04/2015 FINDINGS: Mild bilateral interstitial prominence. No pleural effusion or pneumothorax. No focal consolidation. Stable cardiomegaly. Prior CABG. No acute osseous abnormality. IMPRESSION: Cardiomegaly with mild pulmonary vascular congestion. Electronically Signed   By: Kathreen Devoid   On: 09/28/2017 12:14    Cardiac Studies   Echocardiogram: Pending  Patient Profile     71 y.o. male w/ PMH of CAD (s/p CABG in 1996), HTN, HLD, Stage 3 CKD, and GERD who presented to [facility] ED on 09/28/2017 for evaluation of palpitations, found to be in new-onset atrial  fibrillation with RVR.  Assessment & Plan    1. New-onset Atrial Fibrillation with RVR - the patient presented with constant palpitations for the past 3 days. Denies any associated chest discomfort, dyspnea, lightheadedness, dizziness, or presyncope. Found to be in new-onset atrial fibrillation with RVR.  - Labs show Hgb 13.4, Na+ 137, K+ 3.9, and creatinine 1.94. BNP elevated to 292. TSH 2.238. Mg 1.7 (will replace).   - he has been started on IV Cardizem and continued on PTA Coreg for rate control and rates are currently well-controlled on IV Cardizem at 2.5mg /hr. Will convert to PO Cardizem 30mg  Q8H. Switch to Cardizem CD if HR and BP remain stable. Echo is pending to assess LV function and wall motion. If EF found to be reduced, would prefer to use BB therapy for rate-control in place of Cardizem.  - This patients CHA2DS2-VASc Score and unadjusted Ischemic Stroke Rate (% per year) is equal to at least 3.2 % stroke rate/year from a score of 3 (HTN, Vascular, Age). Currently on full-dose Lovenox. Scheduled to start Eliquis 5mg  BID this AM.   2. Elevated Troponin/CAD - s/p CABG in 1996 (performed at [facility]). No records currently available for review.  - cyclic troponin values have been flat at 0.29, 0.36, 0.32, and 0.32. Likely secondary to demand ischemia in the setting of his tachycardia. He denies any recent anginal symptoms.  - echo is pending to assess LV function and wall motion.  - continue statin and BB therapy. No ASA secondary to the need for anticoagulation.   3. HTN - on Coreg 25mg  BID and Enalapril 5mg  daily PTA. Will hold Enalapril for now as SBP was in the 80's overnight. BP now improved to 112/81 on most recent check. Continue Coreg and Cardizem.   4. HLD - no recent FLP on file. Goal LDL is < 70 with known CAD.  - remains on Atorvastatin 80mg  daily.   5. Stage 3 CKD - creatinine elevated to 1.94 on admission (appears close to baseline). Remains stable this AM.   6.  Alcohol Use - reports consuming 2-3 drinks per day. Cessation advised.   For questions or updates, please contact [place] Please consult [email or web address] for contact info under Cardiology/STEMI.   Signed, Erma Heritage , PA-C 7:32 AM 09/29/2017 Pager: [phone]  The patient was seen and examined, and I agree with the history, physical exam, assessment and plan as documented above, with modifications as noted below.  He feels better today and denies chest pain and palpitations.  Most recent blood pressure at 9:30 AM was 80/50.  Enalapril has been stopped.  He is off diltiazem drip and we will  utilize short acting diltiazem 30 mg every 8 hours for rate control.  Once echocardiogram has been performed and if left ventricular systolic function is normal, short acting diltiazem can be consolidated into extended release 120 mg daily.  Carvedilol will be continued at this dose can be reduced should he continue to experience hypotension.  He will start apixaban 5 mg twice daily this morning for systemic anticoagulation.  He should remain off aspirin.  He will continue Lipitor 80 mg daily.   Kate Sable, MD, [facility]  09/29/2017 9:41 AM  ADDENDUM: I personally reviewed the echocardiogram. LV systolic function is severely reduced, LVEF 25-30%. I will stop diltiazem given its negative inotropic effects and increase Coreg to 37.5 mg bid for rate control. Not a candidate for digoxin given advanced chronic kidney disease.

## 2017-09-30 DIAGNOSIS — I4891 Unspecified atrial fibrillation: Secondary | ICD-10-CM | POA: Diagnosis not present

## 2017-09-30 LAB — BASIC METABOLIC PANEL
Anion gap: 7 (ref 5–15)
BUN: 40 mg/dL — ABNORMAL HIGH (ref 6–20)
CHLORIDE: 104 mmol/L (ref 101–111)
CO2: 26 mmol/L (ref 22–32)
Calcium: 8.8 mg/dL — ABNORMAL LOW (ref 8.9–10.3)
Creatinine, Ser: 1.84 mg/dL — ABNORMAL HIGH (ref 0.61–1.24)
GFR calc non Af Amer: 36 mL/min — ABNORMAL LOW (ref >60)
GFR, EST AFRICAN AMERICAN: 41 mL/min — AB (ref >60)
Glucose, Bld: 109 mg/dL — ABNORMAL HIGH (ref 65–99)
POTASSIUM: 4.3 mmol/L (ref 3.5–5.1)
Sodium: 137 mmol/L (ref 135–145)

## 2017-09-30 LAB — MAGNESIUM: Magnesium: 2.7 mg/dL — ABNORMAL HIGH (ref 1.7–2.4)

## 2017-09-30 MED ORDER — POTASSIUM CHLORIDE CRYS ER 20 MEQ PO TBCR
20.0000 meq | EXTENDED_RELEASE_TABLET | Freq: Once | ORAL | Status: AC
  Administered 2017-09-30: 20 meq via ORAL
  Filled 2017-09-30: qty 1

## 2017-09-30 MED ORDER — METOPROLOL TARTRATE 5 MG/5ML IV SOLN
5.0000 mg | Freq: Once | INTRAVENOUS | Status: AC
  Administered 2017-09-30: 5 mg via INTRAVENOUS
  Filled 2017-09-30: qty 5

## 2017-09-30 MED ORDER — DILTIAZEM HCL 30 MG PO TABS
30.0000 mg | ORAL_TABLET | Freq: Three times a day (TID) | ORAL | Status: DC
  Administered 2017-09-30: 30 mg via ORAL
  Filled 2017-09-30: qty 1

## 2017-09-30 MED ORDER — MAGNESIUM SULFATE 2 GM/50ML IV SOLN
2.0000 g | Freq: Once | INTRAVENOUS | Status: AC
  Administered 2017-09-30: 2 g via INTRAVENOUS
  Filled 2017-09-30: qty 50

## 2017-09-30 NOTE — Progress Notes (Signed)
Mr. Alan Vasquez decided he is leaving Against medical advice. Dr.Shah talked to him and I did also. States he has been here for 2 days with no change and is leaving. Signed AMA papers and he dressed himself. His brother is going to transport him home. IV access removed.

## 2017-09-30 NOTE — Discharge Summary (Signed)
Physician Discharge Summary  Alan Vasquez GDJ:[id number] DOB: 11-Dec-1945 DOA: 09/28/2017  [facility]P: [facility]  Admit date: 09/28/2017  Discharge date: 09/30/2017  Admitted From:Home  Disposition:  Home after leaving AMA  Recommendations for Outpatient Follow-up: None; pt has left AMA  Home Health:N/A  Equipment/Devices:N/A  Discharge Condition:Guarded  CODE STATUS: Full  Diet recommendation: Heart Healthy  Brief/Interim Summary:  This is a 72 year old male with history of CAD and alcohol use who presented to the emergency department with palpitations that began 3 days prior.  He was admitted with new onset atrial fibrillation with RVR and was been placed on a Cardizem drip in the stepdown unit. He was initially transitioned to oral Cardizem by cardiology and was maintained on Coreg twice daily.  After his echocardiogram returned demonstrating severe LV dysfunction with EF of 25-30%, cardiology decided to increase his Coreg dose to 37.5 mg twice daily and discontinued his oral Cardizem.  Unfortunately, overnight his heart rate became elevated once again and the patient returned in atrial fibrillation after being in sinus rhythm temporarily.  He remained asymptomatic and was eating breakfast this morning when I saw and examined him.  At that time he was very upset and adamant that he go home.  I stated that it would be best to resume his Cardizem and try and attempt to control his heart rate prior to going home and I assured him that this would not take very long, but he refused to listen and requested that he be allowed to go home.  Nursing staff had the patient sign AMA paperwork and he has now left the hospital.   Discharge Diagnoses:  Principal Problem:   Atrial fibrillation ([place]) Active Problems:   CAD, multiple vessel   Essential hypertension   CKD (chronic kidney disease), stage III [facility])  Discharged AMA  Discharge Instructions  None   Allergies  Allergen  Reactions  . Triamterene Other (See Comments)    Reaction unknown    Consultations:  Cardiology   Procedures/Studies: Dg Chest 2 View  Result Date: 09/28/2017 CLINICAL DATA:  Increased heart rate for 3 days EXAM: CHEST  2 VIEW COMPARISON:  11/04/2015 FINDINGS: Mild bilateral interstitial prominence. No pleural effusion or pneumothorax. No focal consolidation. Stable cardiomegaly. Prior CABG. No acute osseous abnormality. IMPRESSION: Cardiomegaly with mild pulmonary vascular congestion. Electronically Signed   By: Kathreen Devoid   On: 09/28/2017 12:14     Discharge Exam: Vitals:   09/30/17 0740 09/30/17 0800  BP:  101/74  Pulse: (!) 133 (!) 136  Resp:    Temp: 98.1 F (36.7 C)   SpO2: 99% 96%   Vitals:   09/30/17 0600 09/30/17 0630 09/30/17 0740 09/30/17 0800  BP: 110/76 105/86  101/74  Pulse: (!) 134 (!) 129 (!) 133 (!) 136  Resp: (!) 26 (!) 21    Temp:   98.1 F (36.7 C)   TempSrc:   Oral   SpO2: 92% 97% 99% 96%  Weight:      Height:        General: Pt is alert, awake, not in acute distress Cardiovascular: irregular and tachycardic, S1/S2 +, no rubs, no gallops Respiratory: CTA bilaterally, no wheezing, no rhonchi Abdominal: Soft, NT, ND, bowel sounds + Extremities: no edema, no cyanosis    The results of significant diagnostics from this hospitalization (including imaging, microbiology, ancillary and laboratory) are listed below for reference.     Microbiology: Recent Results (from the past 240 hour(s))  MRSA PCR Screening  Status: None   Collection Time: 09/28/17  5:16 PM  Result Value Ref Range Status   MRSA by PCR NEGATIVE NEGATIVE Final    Comment:        The GeneXpert MRSA Assay (FDA approved for NASAL specimens only), is one component of a comprehensive MRSA colonization surveillance program. It is not intended to diagnose MRSA infection nor to guide or monitor treatment for MRSA infections.      Labs: BNP (last 3 results) Recent Labs     09/28/17 1209  BNP 536.6*   Basic Metabolic Panel: Recent Labs  Lab 09/28/17 1209 09/28/17 1523 09/29/17 0325 09/30/17 0451  NA 137  --  135 137  K 3.9  --  3.6 4.3  CL 102  --  101 104  CO2 25  --  24 26  GLUCOSE 143*  --  98 109*  BUN 42*  --  43* 40*  CREATININE 1.94*  --  1.94* 1.84*  CALCIUM 9.2  --  8.4* 8.8*  MG  --  1.9 1.7 2.7*   Liver Function Tests: No results for input(s): AST, ALT, ALKPHOS, BILITOT, PROT, ALBUMIN in the last 168 hours. No results for input(s): LIPASE, AMYLASE in the last 168 hours. No results for input(s): AMMONIA in the last 168 hours. CBC: Recent Labs  Lab 09/28/17 1209 09/29/17 0325  WBC 6.0 5.7  HGB 13.4 12.4*  HCT 40.1 36.9*  MCV 93.3 92.9  PLT 118* 105*   Cardiac Enzymes: Recent Labs  Lab 09/28/17 1209 09/28/17 1523 09/28/17 2117 09/29/17 0325  TROPONINI 0.29* 0.36* 0.32* 0.32*   BNP: Invalid input(s): POCBNP CBG: No results for input(s): GLUCAP in the last 168 hours. D-Dimer No results for input(s): DDIMER in the last 72 hours. Hgb A1c No results for input(s): HGBA1C in the last 72 hours. Lipid Profile No results for input(s): CHOL, HDL, LDLCALC, TRIG, CHOLHDL, LDLDIRECT in the last 72 hours. Thyroid function studies Recent Labs    09/28/17 1523  TSH 2.238   Anemia work up No results for input(s): VITAMINB12, FOLATE, FERRITIN, TIBC, IRON, RETICCTPCT in the last 72 hours. Urinalysis    Component Value Date/Time   COLORURINE YELLOW 11/04/2015 1220   APPEARANCEUR CLEAR 11/04/2015 1220   LABSPEC 1.020 11/04/2015 1220   PHURINE 5.5 11/04/2015 1220   GLUCOSEU NEGATIVE 11/04/2015 1220   HGBUR NEGATIVE 11/04/2015 1220   BILIRUBINUR NEGATIVE 11/04/2015 1220   KETONESUR NEGATIVE 11/04/2015 1220   PROTEINUR NEGATIVE 11/04/2015 1220   UROBILINOGEN 0.2 10/20/2013 0030   NITRITE NEGATIVE 11/04/2015 1220   LEUKOCYTESUR NEGATIVE 11/04/2015 1220   Sepsis Labs Invalid input(s): PROCALCITONIN,  WBC,   LACTICIDVEN Microbiology Recent Results (from the past 240 hour(s))  MRSA PCR Screening     Status: None   Collection Time: 09/28/17  5:16 PM  Result Value Ref Range Status   MRSA by PCR NEGATIVE NEGATIVE Final    Comment:        The GeneXpert MRSA Assay (FDA approved for NASAL specimens only), is one component of a comprehensive MRSA colonization surveillance program. It is not intended to diagnose MRSA infection nor to guide or monitor treatment for MRSA infections.      Time coordinating discharge: Over 30 minutes  SIGNED:   Rodena Goldmann, DO [facility] 09/30/2017, 11:48 AM Pager [phone]  If 7PM-7AM, please contact night-coverage [email or web address] Password [email or web address]

## 2017-09-30 NOTE — Progress Notes (Signed)
Pt's HR remains 130's. Pt is asymptomatic. MD paged and Elink called with concerns. MD ordered 5mg  Metoprolol, 2g magnesium and oral 81meq K+.  Will continue to monitor

## 2017-10-12 ENCOUNTER — Ambulatory Visit: Payer: Non-veteran care | Admitting: Podiatry

## 2017-11-09 NOTE — Progress Notes (Signed)
REVIEWED-NO ADDITIONAL RECOMMENDATIONS. 

## 2017-11-17 ENCOUNTER — Ambulatory Visit: Payer: Non-veteran care | Admitting: Podiatry

## 2017-12-11 DEATH — deceased

## 2017-12-15 IMAGING — DX DG CHEST 2V
2 series · 2 of 2 positions shown · non-contrast
Comparison: PA and lateral chest 08/04/2015 and 10/19/2013.

CLINICAL DATA: Chest pain and weakness for 3 weeks. Initial
encounter.

EXAM:
CHEST  2 VIEW

[chest pa]
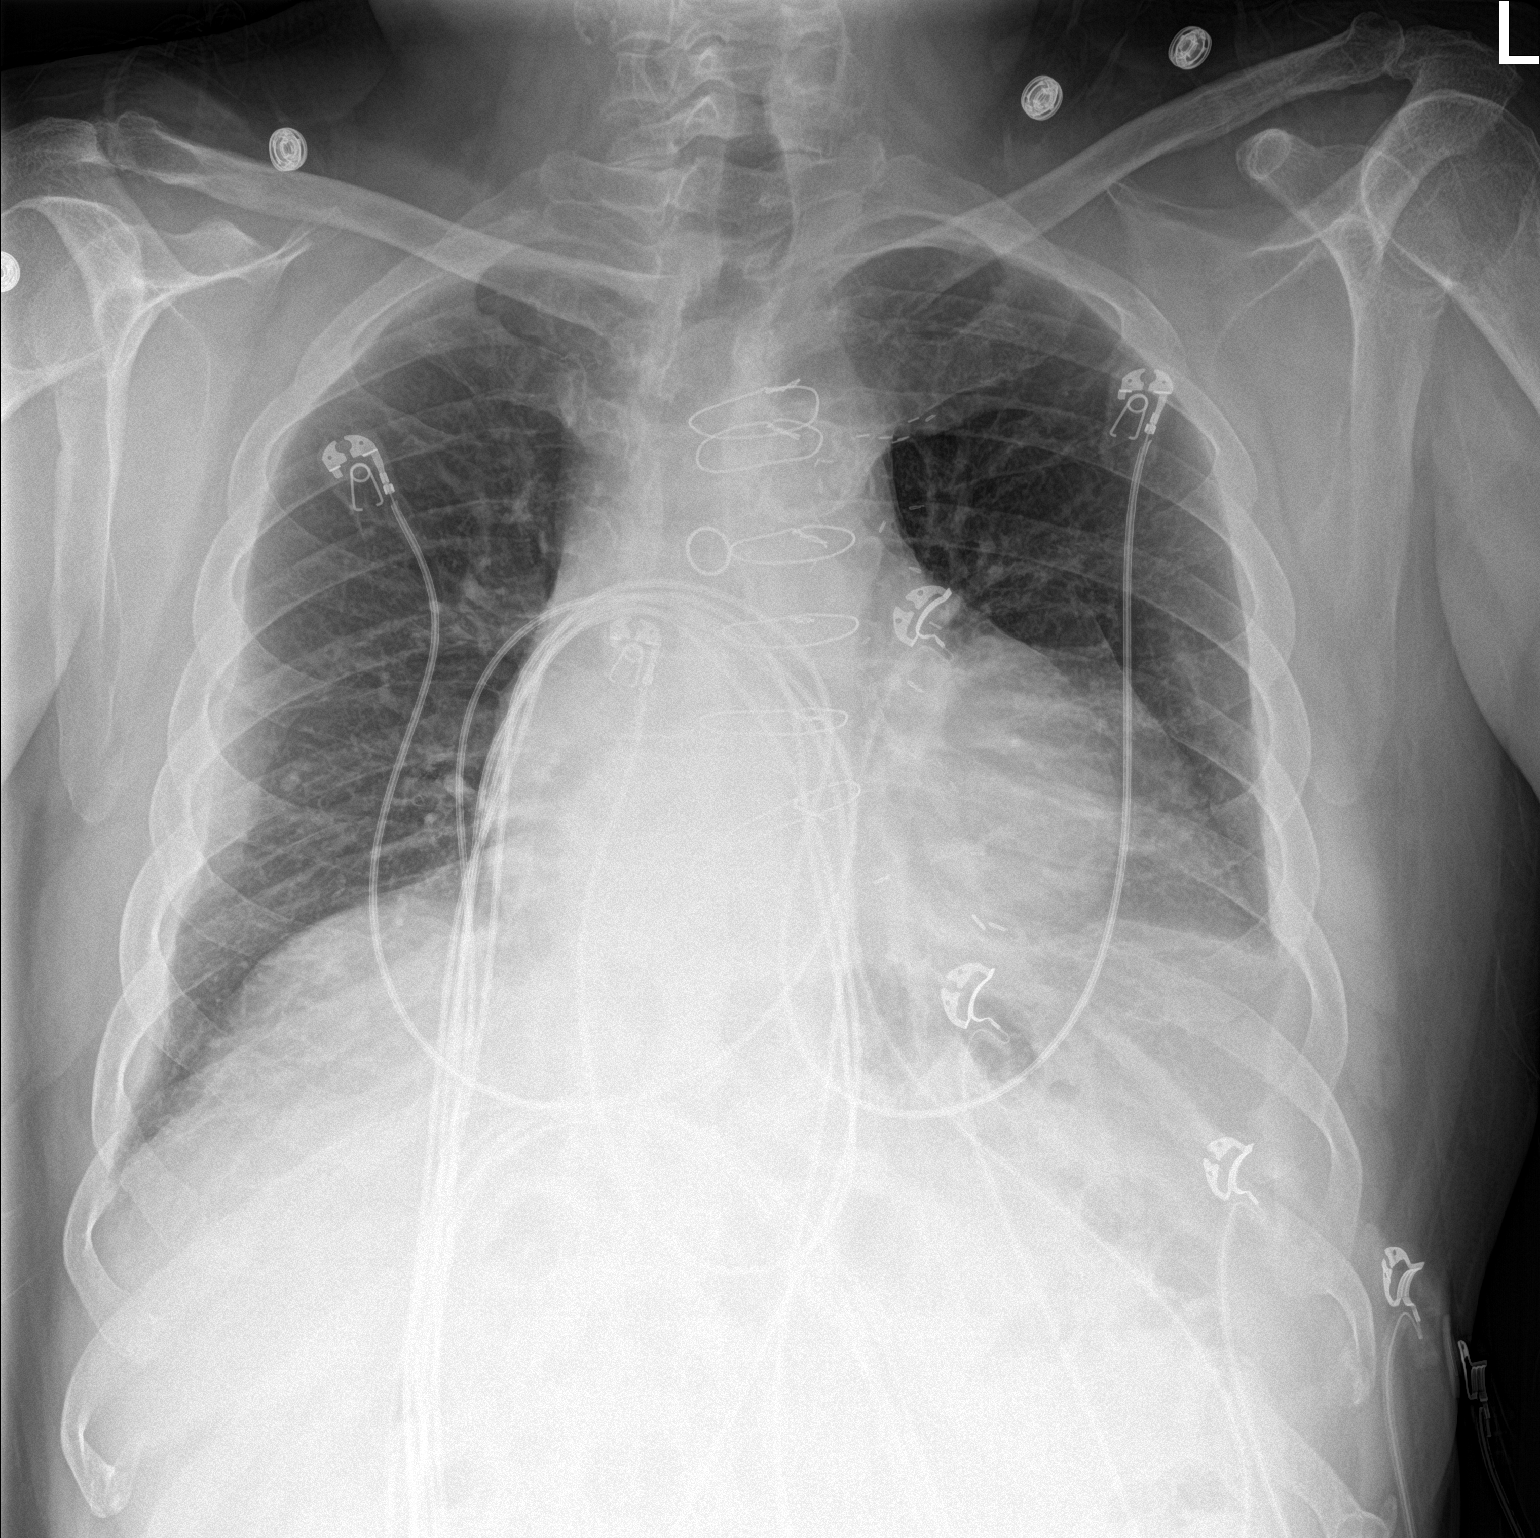

[chest lat]
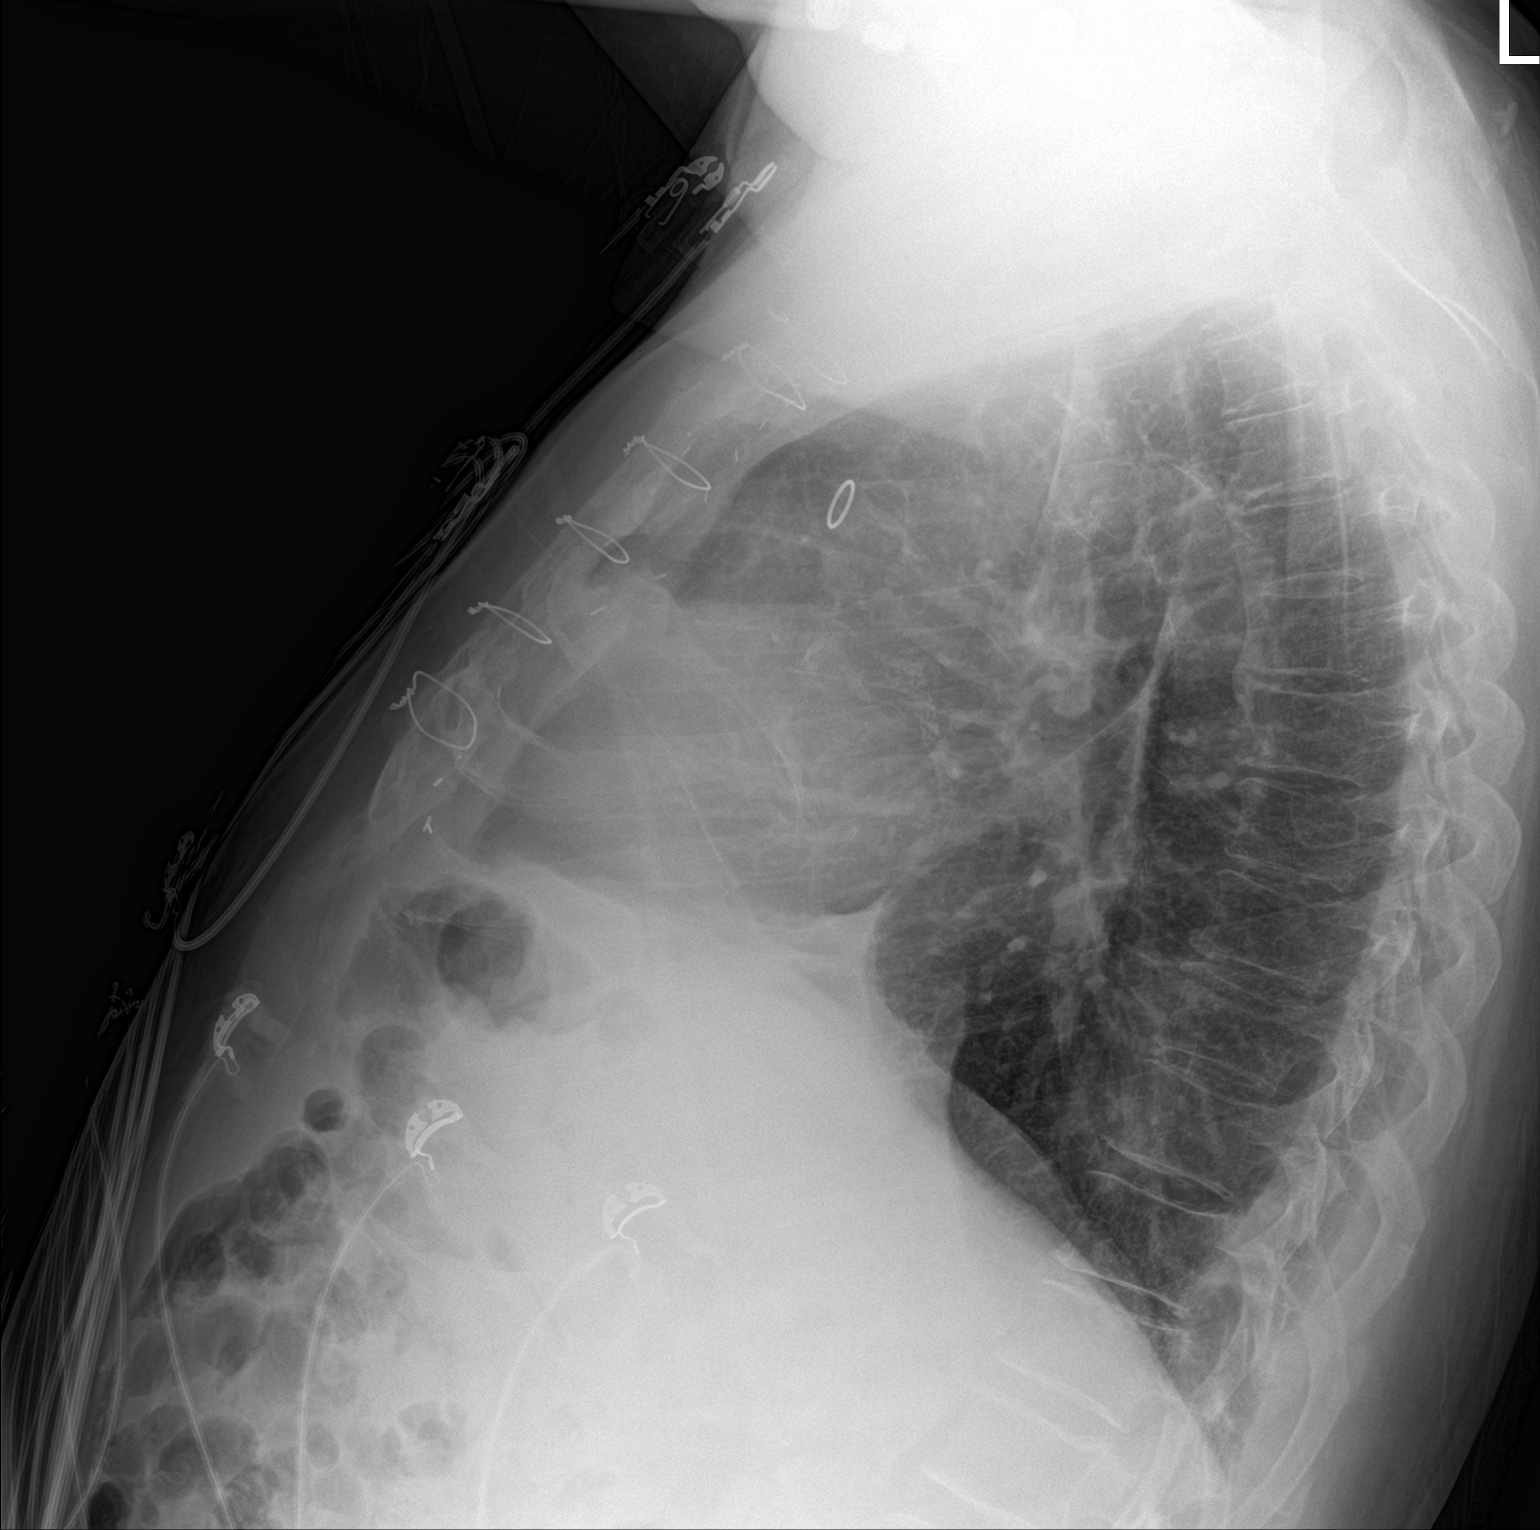

[2 of 2 positions shown; findings below may reference images not displayed]

FINDINGS: The patient is status post CABG. There is marked cardiomegaly
without edema. No pneumothorax or pleural effusion. No focal bony
abnormality.
IMPRESSION: Marked cardiomegaly without acute disease.
